# Patient Record
Sex: Female | Born: 1980 | Race: Black or African American | Hispanic: No | Smoking: Never smoker
Health system: Southern US, Community
[De-identification: ages and names within clinical notes are randomized; demographics above are authoritative.]

## PROBLEM LIST (undated history)

## (undated) DIAGNOSIS — F419 Anxiety disorder, unspecified: Secondary | ICD-10-CM

## (undated) DIAGNOSIS — D649 Anemia, unspecified: Secondary | ICD-10-CM

## (undated) DIAGNOSIS — F32A Depression, unspecified: Secondary | ICD-10-CM

## (undated) DIAGNOSIS — F329 Major depressive disorder, single episode, unspecified: Secondary | ICD-10-CM

## (undated) DIAGNOSIS — Z872 Personal history of diseases of the skin and subcutaneous tissue: Secondary | ICD-10-CM

## (undated) DIAGNOSIS — Z8742 Personal history of other diseases of the female genital tract: Secondary | ICD-10-CM

## (undated) DIAGNOSIS — M797 Fibromyalgia: Secondary | ICD-10-CM

## (undated) DIAGNOSIS — Z86018 Personal history of other benign neoplasm: Secondary | ICD-10-CM

## (undated) HISTORY — DX: Depression, unspecified: F32.A

## (undated) HISTORY — DX: Anxiety disorder, unspecified: F41.9

## (undated) HISTORY — DX: Personal history of diseases of the skin and subcutaneous tissue: Z87.2

## (undated) HISTORY — DX: Anemia, unspecified: D64.9

## (undated) HISTORY — PX: ABDOMINAL HYSTERECTOMY: SHX81

## (undated) HISTORY — DX: Major depressive disorder, single episode, unspecified: F32.9

## (undated) HISTORY — DX: Personal history of other benign neoplasm: Z86.018

## (undated) HISTORY — DX: Fibromyalgia: M79.7

## (undated) HISTORY — DX: Personal history of other diseases of the female genital tract: Z87.42

---

## 1999-06-07 HISTORY — PX: TUBAL LIGATION: SHX77

## 2006-06-06 HISTORY — PX: HYSTEROSCOPY: SHX211

## 2006-07-18 ENCOUNTER — Other Ambulatory Visit: Admission: RE | Admit: 2006-07-18 | Discharge: 2006-07-18 | Payer: Self-pay | Admitting: Obstetrics and Gynecology

## 2006-10-20 ENCOUNTER — Ambulatory Visit (HOSPITAL_COMMUNITY): Admission: RE | Admit: 2006-10-20 | Discharge: 2006-10-20 | Payer: Self-pay | Admitting: Obstetrics and Gynecology

## 2006-10-20 ENCOUNTER — Encounter (INDEPENDENT_AMBULATORY_CARE_PROVIDER_SITE_OTHER): Payer: Self-pay | Admitting: Obstetrics and Gynecology

## 2007-06-07 HISTORY — PX: LAPAROSCOPIC VAGINAL HYSTERECTOMY: SUR798

## 2007-08-29 ENCOUNTER — Ambulatory Visit (HOSPITAL_COMMUNITY): Admission: RE | Admit: 2007-08-29 | Discharge: 2007-08-30 | Payer: Self-pay | Admitting: Obstetrics and Gynecology

## 2007-08-29 ENCOUNTER — Encounter (INDEPENDENT_AMBULATORY_CARE_PROVIDER_SITE_OTHER): Payer: Self-pay | Admitting: Obstetrics and Gynecology

## 2009-12-12 ENCOUNTER — Emergency Department (HOSPITAL_COMMUNITY): Admission: EM | Admit: 2009-12-12 | Discharge: 2009-12-12 | Payer: Self-pay | Admitting: Emergency Medicine
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
## 2010-10-19 NOTE — Op Note (Signed)
NAME:  Mary Joyce, Mary Joyce                ACCOUNT NO.:  [id number]   MEDICAL RECORD NO.:  [id number]          PATIENT TYPE:  OIB   LOCATION:  9312                          [facility]ILITY:  [facility]   PHYSICIAN:  Huel Cote, M.D. DATE OF BIRTH:  04-25-1981   DATE OF PROCEDURE:  08/29/2007  DATE OF DISCHARGE:                               OPERATIVE REPORT   PREOPERATIVE DIAGNOSES:  1. Menorrhagia.  2. Anemia.  3. Fibroids.   POSTOPERATIVE DIAGNOSES:  1. Menorrhagia.  2. Anemia.  3. Fibroids.   PROCEDURE:  Laparoscopic assisted vaginal hysterectomy.   SURGEON:  Huel Cote, MD   ASSISTANT:  Bing Plume, MD   ANESTHESIA:  General.   FINDINGS:  The uterus was enlarged approximately 10 weeks in size.  Ovaries were within normal limits.  Other abdominal and pelvic  structures were normal.   SPECIMENS:  Uterus and cervix were sent to pathology.   ESTIMATED BLOOD LOSS:  One hundred fifty mL.   URINE OUTPUT:  One hundred fifth mL clear urine.   IV FLUIDS:  Two thousand mL LR.   PROCEDURE IN DETAIL:  The patient was taken to the operating room where  general anesthesia was obtained without difficulty.  She was then  prepped and draped in normal sterile fashion in the dorsal lithotomy  position.  A speculum was placed in the vagina, the cervix identified  and the Hulka tenaculum placed within it for uterine manipulation.  Foley catheter was placed.  Attention was then turned to the abdomen  where an infraumbilical incision was made with a scalpel approximately 1  cm in length.  The Veress needle was then introduced into the abdominal  cavity and normal pressure of approximately 3-4 was noted when  pneumoperitoneum was obtained with approximately 3 liters of CO2 gas.  Veress needle was then removed and the 5 mm OptiView trocar was utilized  to visualize direct entry into the peritoneal cavity.  The 5 mm scope  was then introduced and the patient placed in Trendelenburg and the  pelvis and abdomen inspected with the findings as previously stated.  Under direct visualization two additional 5 mm ports were placed in the  lateral quadrants.  Each trocar site was injected with quarter percent  Marcaine prior to incision.  These were placed without difficulty and  the patient's left adnexa was retracted somewhat medially.  The utero-  ovarian ligament was then taken down sequentially with the Harmonic  scalpel as well as the round ligament and the bladder flap was developed  with the Harmonic scalpel as well on this side.  Attention was then  turned to the right adnexa where in a similar fashion the utero-ovarian  ligament and the round ligament and broad ligament were taken down with  Harmonic scalpel.  The bladder flap was connected in the midline and  pushed away from the underlying cervix.  All areas were inspected and  there was no active bleeding noted.  Therefore all instruments were  removed from the trocars which were left in place and attention was  turned vaginally.   The Hulka  tenaculum was removed and two Jacobson tenaculums placed on  the cervix.  It was injected with a dilute solution of Pitressin 20 and  100 circumferentially.  Bovie cautery was then utilized to make a  circumferential incision around the cervix and the overlying vaginal  mucosa was trimmed away with Mayo scissors.  Posterior cul-de-sac was  entered sharply and the banana speculum was placed within it.  The  anterior cul-de-sac was likewise dissected and entered sharply and Sims  retractor placed within it.  The uterosacral ligaments were then clamped  with Zeppelin clamps, transected and suture ligated with zero Vicryl and  held in a hemostat.  The remainder of the broad ligament was taken down  sequentially with bites of Zeppelin clamps and suture ligated with zero  Vicryl at each step.  This was carried up to the level of the anterior  part of the broad ligament and the uterus was  delivered and everted in  the vagina.  The last remaining pedicle was taken down and the uterus  completely amputated and was suture ligated with zero Vicryl as well.  Uterus and cervix were handed off to pathology.  Pedicles were inspected  and found to be hemostatic.  One additional suture of zero Vicryl was  placed in a figure-of-eight nature on the patient's left which was  bleeding slightly.  This had a nice result and there was good hemostasis  noted.  The uterosacral ligaments were reapproximated with one suture of  zero Vicryl in an interrupted fashion and then the previously placed  sutures were tied to one another and the remainder trimmed away.  The  vaginal cuff was then closed with 2-0 Vicryl in a running locked fashion  and excellent hemostasis noted.  All instruments and sponges were  counted and removed from the vagina and instrument and sponge counts  were correct.   Attention was then re-returned to the patient's abdomen after gloves  were changed and the pneumoperitoneum once again obtained.  The patient  was placed in Trendelenburg and the cuff and ovaries inspected.  There  was no active bleeding noted and the irrigator  was utilized to irrigate  and inspect all areas.  The ureter was clearly visualized and  peristalsing on the patient's right.  On the left it was slightly more  difficult to see, however, appeared well away from the surgical field  even though it was not peristalsing as much.  The remainder of the  abdomen and pelvis appeared normal, therefore, all trocars were removed  under direct visualization and the pneumoperitoneum reduced through the  umbilical trocar.  The sites were then closed with 3-0 Vicryl in a  subcuticular stitch and all sponge, lap and needle counts were again  correct x2 and the patient was taken to the recovery room in stable  condition.      Huel Cote, M.D.  Electronically Signed     KR/MEDQ  D:  08/29/2007  T:   08/29/2007  Job:  [id number]

## 2010-10-22 NOTE — Op Note (Signed)
NAME:  Mary Joyce, Mary Joyce                ACCOUNT NO.:  [id number]   MEDICAL RECORD NO.:  [id number]          PATIENT TYPE:  AMB   LOCATION:  SDC                           [facility]   PHYSICIAN:  James A. Ashley Royalty, M.D.DATE OF BIRTH:  August 12, 1980   DATE OF PROCEDURE:  10/20/2006  DATE OF DISCHARGE:                               OPERATIVE REPORT   PREOPERATIVE DIAGNOSES:  1. Fibroid uterus including possible submucosal fibroid.  2. Menorrhagia - possibly secondary to #1.   POSTOPERATIVE DIAGNOSIS:  Submucosal fibroid.  Pathology pending.   PROCEDURE:  1. Diagnostic/operative hysteroscopy.  2. Dilatation and curettage.   SURGEON:  Rudy Jew. Ashley Royalty, M.D.   ANESTHESIA:  General.   ESTIMATED BLOOD LOSS:  Less 25 mL.   COMPLICATIONS:  None.   PACKS AND DRAINS:  None.   PROCEDURE:  The patient was taken to the operating room and placed in  the dorsal supine position.  After general anesthetic was administered,  she was placed in the lithotomy position and prepped and draped in usual  manner for vaginal surgery.  Posterior weighted retractor was placed per  vagina.  The anterior lip of the cervix was grasped with a single-tooth  tenaculum.  The uterus was gently sounded to approximately 8 cm and  noted be slightly retroverted.  The tubal ostia were visualized without  difficulty.  On the posterior surface of the uterine cavity, there was  apparent sessile fibroid present.  Appropriate photos were obtained.  Using the resectoscope at 40 watts power and the cutting waveform, the  submucosal component of the apparent fibroid was resected.  The specimen  was removed in a piecemeal fashion and submitted pathology for  histologic studies.  Hemostasis was obtained with the coagulation  waveform at approximately 40 watts power.   At this point, the patient was felt to have benefited maximally from the  operative portion of the procedure.   Attention was then turned to the uterine  curettage.  A medium size  curette was introduced in the uterine cavity, and an initial four  quadrant curettage technique was employed.  Then, a therapeutic  technique was employed.  All curettings were submitted to pathology for  histologic studies.   At this point, the patient was felt to have benefited maximally from the  surgical procedure.  The vaginal instruments were removed, hemostasis  noted and the procedure terminated.   The patient was taken to the recovery room in good condition.      James A. Ashley Royalty, M.D.  Electronically Signed     JAM/MEDQ  D:  10/20/2006  T:  10/20/2006  Job:  [id number]
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
## 2011-02-28 LAB — CBC
Hemoglobin: 10 — ABNORMAL LOW
MCHC: 32.5
MCHC: 33.2
MCV: 67.4 — ABNORMAL LOW
MCV: 68.6 — ABNORMAL LOW
Platelets: 379
RBC: 4.48

## 2011-07-30 ENCOUNTER — Other Ambulatory Visit: Payer: Self-pay | Admitting: Family Medicine

## 2011-09-18 ENCOUNTER — Other Ambulatory Visit: Payer: Self-pay | Admitting: Physician Assistant

## 2012-07-13 ENCOUNTER — Other Ambulatory Visit: Payer: Self-pay | Admitting: Family Medicine

## 2013-01-30 ENCOUNTER — Ambulatory Visit (INDEPENDENT_AMBULATORY_CARE_PROVIDER_SITE_OTHER): Payer: BC Managed Care – PPO | Admitting: Family Medicine

## 2013-01-30 VITALS — BP 118/84 | HR 66 | Temp 98.8°F | Resp 18 | Ht 66.0 in | Wt 185.8 lb

## 2013-01-30 DIAGNOSIS — R531 Weakness: Secondary | ICD-10-CM

## 2013-01-30 DIAGNOSIS — R5381 Other malaise: Secondary | ICD-10-CM

## 2013-01-30 DIAGNOSIS — R42 Dizziness and giddiness: Secondary | ICD-10-CM

## 2013-01-30 DIAGNOSIS — G4700 Insomnia, unspecified: Secondary | ICD-10-CM

## 2013-01-30 LAB — GLUCOSE, POCT (MANUAL RESULT ENTRY): POC Glucose: 98 mg/dl (ref 70–99)

## 2013-01-30 LAB — POCT CBC
Granulocyte percent: 59.9 %G (ref 37–80)
HCT, POC: 38.3 % (ref 37.7–47.9)
Hemoglobin: 12.2 g/dL (ref 12.2–16.2)
Lymph, poc: 1.8 (ref 0.6–3.4)
MCH, POC: 25.3 pg — AB (ref 27–31.2)
MCHC: 31.9 g/dL (ref 31.8–35.4)
MCV: 79.5 fL — AB (ref 80–97)
MID (cbc): 0.4 (ref 0–0.9)
MPV: 8.4 fL (ref 0–99.8)
POC Granulocyte: 3.4 (ref 2–6.9)
POC LYMPH PERCENT: 32.1 %L (ref 10–50)
POC MID %: 8 %M (ref 0–12)
Platelet Count, POC: 346 10*3/uL (ref 142–424)
RBC: 4.82 M/uL (ref 4.04–5.48)
RDW, POC: 15.2 %
WBC: 5.6 10*3/uL (ref 4.6–10.2)

## 2013-01-30 LAB — COMPREHENSIVE METABOLIC PANEL
ALT: 12 U/L (ref 0–35)
AST: 12 U/L (ref 0–37)
Albumin: 4.2 g/dL (ref 3.5–5.2)
Alkaline Phosphatase: 57 U/L (ref 39–117)
BUN: 12 mg/dL (ref 6–23)
CO2: 27 mEq/L (ref 19–32)
Calcium: 9.5 mg/dL (ref 8.4–10.5)
Chloride: 106 mEq/L (ref 96–112)
Creat: 0.63 mg/dL (ref 0.50–1.10)
Glucose, Bld: 91 mg/dL (ref 70–99)
Potassium: 4 mEq/L (ref 3.5–5.3)
Sodium: 138 mEq/L (ref 135–145)
Total Bilirubin: 0.5 mg/dL (ref 0.3–1.2)
Total Protein: 7.3 g/dL (ref 6.0–8.3)

## 2013-01-30 LAB — TSH: TSH: 2.077 u[IU]/mL (ref 0.350–4.500)

## 2013-01-30 MED ORDER — TRAZODONE HCL 150 MG PO TABS: 150.0000 mg | ORAL_TABLET | Freq: Every day | ORAL | Status: DC

## 2013-01-30 MED ORDER — CLONAZEPAM 1 MG PO TABS: 2.0000 mg | ORAL_TABLET | Freq: Every evening | ORAL | Status: DC | PRN
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
## 2013-02-13 ENCOUNTER — Ambulatory Visit: Payer: BC Managed Care – PPO

## 2013-02-13 ENCOUNTER — Emergency Department (HOSPITAL_COMMUNITY)
Admission: EM | Admit: 2013-02-13 | Discharge: 2013-02-13 | Disposition: A | Discharge status: Home / Self Care | Payer: BC Managed Care – PPO | Attending: Emergency Medicine | Admitting: Emergency Medicine

## 2013-02-13 ENCOUNTER — Ambulatory Visit (INDEPENDENT_AMBULATORY_CARE_PROVIDER_SITE_OTHER): Payer: BC Managed Care – PPO | Admitting: Family Medicine

## 2013-02-13 ENCOUNTER — Encounter (HOSPITAL_COMMUNITY): Payer: Self-pay

## 2013-02-13 VITALS — BP 110/88 | HR 76 | Temp 98.8°F | Resp 18 | Ht 66.0 in | Wt 185.8 lb

## 2013-02-13 DIAGNOSIS — R079 Chest pain, unspecified: Secondary | ICD-10-CM

## 2013-02-13 DIAGNOSIS — I319 Disease of pericardium, unspecified: Secondary | ICD-10-CM | POA: Insufficient documentation

## 2013-02-13 DIAGNOSIS — F329 Major depressive disorder, single episode, unspecified: Secondary | ICD-10-CM | POA: Insufficient documentation

## 2013-02-13 DIAGNOSIS — F411 Generalized anxiety disorder: Secondary | ICD-10-CM | POA: Insufficient documentation

## 2013-02-13 DIAGNOSIS — R197 Diarrhea, unspecified: Secondary | ICD-10-CM | POA: Insufficient documentation

## 2013-02-13 DIAGNOSIS — R51 Headache: Secondary | ICD-10-CM | POA: Insufficient documentation

## 2013-02-13 DIAGNOSIS — Z862 Personal history of diseases of the blood and blood-forming organs and certain disorders involving the immune mechanism: Secondary | ICD-10-CM | POA: Insufficient documentation

## 2013-02-13 DIAGNOSIS — R0789 Other chest pain: Secondary | ICD-10-CM | POA: Insufficient documentation

## 2013-02-13 DIAGNOSIS — R42 Dizziness and giddiness: Secondary | ICD-10-CM

## 2013-02-13 DIAGNOSIS — F3289 Other specified depressive episodes: Secondary | ICD-10-CM | POA: Insufficient documentation

## 2013-02-13 DIAGNOSIS — R112 Nausea with vomiting, unspecified: Secondary | ICD-10-CM | POA: Insufficient documentation

## 2013-02-13 LAB — BASIC METABOLIC PANEL
Calcium: 9.5 mg/dL (ref 8.4–10.5)
GFR calc non Af Amer: >90 mL/min (ref >90)
Glucose, Bld: 90 mg/dL (ref 70–99)
Potassium: 3.9 mEq/L (ref 3.5–5.1)
Sodium: 132 mEq/L — ABNORMAL LOW (ref 135–145)

## 2013-02-13 LAB — CBC
Hemoglobin: 12.8 g/dL (ref 12.0–15.0)
MCHC: 35.5 g/dL (ref 30.0–36.0)
Platelets: 379 10*3/uL (ref 150–400)
RBC: 4.94 MIL/uL (ref 3.87–5.11)

## 2013-02-13 LAB — TROPONIN I: Troponin I: <0.3 ng/mL (ref <0.30)

## 2013-02-13 LAB — POCT I-STAT TROPONIN I: Troponin i, poc: 0 ng/mL (ref 0.00–0.08)

## 2013-02-13 MED ORDER — NAPROXEN 500 MG PO TABS: 500.0000 mg | ORAL_TABLET | Freq: Two times a day (BID) | ORAL | Status: DC

## 2013-02-13 MED ORDER — IBUPROFEN 800 MG PO TABS
800.0000 mg | ORAL_TABLET | Freq: Once | ORAL | Status: AC
  Administered 2013-02-13: 800 mg via ORAL
  Filled 2013-02-13: qty 1

## 2013-02-13 NOTE — ED Notes (Signed)
Pt c/o one occurrence of Left side chest pain 2 weeks ago, and another episode of chest pain, N/V/D, and feeling lightheaded. Pt seen at [facility] and prescribed medication for anxiety
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
## 2013-02-13 NOTE — ED Provider Notes (Signed)
Mary Joyce S 8:00 PM patient discussed in sign out. Patient with low risk factors for CAD presented with complaints of chest pain for the past 2 weeks. Patient is very anxious and has history of anxiety. Does have some increased stress. Plan to rule out possible ACS with second troponin. Initial EKG and troponin unremarkable. Other labs also unremarkable.  Second troponin negative. Patient felt stable for discharge home to followup with PCP  Angus Seller, PA-C 02/14/13 [phone]

## 2013-02-13 NOTE — ED Provider Notes (Signed)
CSN: [id number]     Arrival date & time 02/13/13  1638 History   First MD Initiated Contact with Patient 02/13/13 1910     Chief Complaint  Patient presents with  . Chest Pain   (Consider location/radiation/quality/duration/timing/severity/associated sxs/prior Treatment) HPI Pt is a 32yo female with hx of anxiety, depression, and anemia c/o intermittent left sided chest pain, initially started 2 weeks ago but came back again earlier today, sharp in nature, 5/10, lasted about 1 hour, started while pt was at work working under stressful conditions, also c/o N/V/D and feeling "extremely" lightheaded.  Pt was evaluated last week at [facility] and prescribe anxiety medication for sleep.  Pt states the medication initially helped with her sleep but has stopped working.  Pt was reevaluated at [facility] today for CP and advised to come to ED for further evaluation after negative CXR and benign PE.  Denies fever, recent travel, sick contacts, or personal hx of heart problems.  Does report father died for CHF at age 84yo and paternal grandparents having CAD.    Past Medical History  Diagnosis Date  . Anemia   . Anxiety   . Depression    Past Surgical History  Procedure Laterality Date  . Cesarean section    . Abdominal hysterectomy     Family History  Problem Relation Age of Onset  . Hypertension Mother   . Heart disease Father    History  Substance Use Topics  . Smoking status: Never Smoker   . Smokeless tobacco: Not on file  . Alcohol Use: No   OB History   Grav Para Term Preterm Abortions TAB SAB Ect Mult Living                 Review of Systems  Constitutional: Negative for fever and chills.  HENT: Negative for neck pain and neck stiffness.   Respiratory: Negative for cough and shortness of breath.   Cardiovascular: Positive for chest pain. Negative for palpitations and leg swelling.  Gastrointestinal: Positive for nausea, vomiting and diarrhea. Negative for abdominal pain.  Neurological:  Positive for light-headedness. Negative for dizziness and headaches.  All other systems reviewed and are negative.    Allergies  Review of patient's allergies indicates no known allergies.  Home Medications   Current Outpatient Rx  Name  Route  Sig  Dispense  Refill  . clonazePAM (KLONOPIN) 1 MG tablet   Oral   Take 2 tablets (2 mg total) by mouth at bedtime as needed for anxiety.   30 tablet   2   . traZODone (DESYREL) 150 MG tablet   Oral   Take 1 tablet (150 mg total) by mouth at bedtime.   30 tablet   2    BP 117/90  Pulse 64  Temp(Src) 98.7 F (37.1 C) (Oral)  Resp 16  Ht 5\' 5"  (1.651 m)  Wt 185 lb (83.915 kg)  BMI 30.79 kg/m2  SpO2 100% Physical Exam  Nursing note and vitals reviewed. Constitutional: She is oriented to person, place, and time. She appears well-developed and well-nourished. No distress.  Pt appears well. NAD.  HENT:  Head: Normocephalic and atraumatic.  Eyes: Conjunctivae and EOM are normal. Pupils are equal, round, and reactive to light. Right eye exhibits no discharge. Left eye exhibits no discharge. No scleral icterus.  Neck: Normal range of motion. Neck supple.  No nuchal rigidity or meningeal signs.    Cardiovascular: Normal rate, regular rhythm and normal heart sounds.  Exam reveals no gallop  and no friction rub.   No murmur heard. Nl rate and rhythm w/o murmurs, rubs or gallops  Pulmonary/Chest: Effort normal and breath sounds normal. No respiratory distress. She has no wheezes. She has no rales. She exhibits no tenderness.  Lungs: CTAB. No respiratory distress. Able to speak in full sentences. Chest pain not reproducible.   Abdominal: Soft. Bowel sounds are normal. She exhibits no distension and no mass. There is no tenderness. There is no rebound and no guarding.  Musculoskeletal: Normal range of motion.  Neurological: She is alert and oriented to person, place, and time. She has normal strength. No cranial nerve deficit or sensory  deficit. Coordination normal. GCS eye subscore is 4. GCS verbal subscore is 5. GCS motor subscore is 6.  Skin: Skin is warm and dry. She is not diaphoretic.    ED Course  Procedures (including critical care time) Labs Review Labs Reviewed  CBC - Abnormal; Notable for the following:    MCV 73.1 (*)    MCH 25.9 (*)    All other components within normal limits  BASIC METABOLIC PANEL - Abnormal; Notable for the following:    Sodium 132 (*)    All other components within normal limits  TROPONIN I  POCT I-STAT TROPONIN I   Imaging Review Dg Chest 2 View  02/13/2013   *RADIOLOGY REPORT*  Clinical Data: Chest pain  CHEST - 2 VIEW  Comparison: None.  Findings: Cardiomediastinal silhouette appears normal.  No acute pulmonary disease is noted.  Bony thorax is intact.  IMPRESSION: No acute cardiopulmonary abnormality seen.  Clinically significant discrepancy from primary report, if provided: None   Original Report Authenticated By: Lupita Raider.,  M.D.     Date: 02/13/2013  Rate: 60  Rhythm: premature atrial contractions (PAC)  QRS Axis: normal  Intervals: normal  ST/T Wave abnormalities: nonspecific ST/T changes  Conduction Disutrbances:none  Narrative Interpretation: abnormal, unusual P axis, possible ectopic atrial rhythm with PACs  Old EKG Reviewed: none available    MDM   1. Pericarditis   2. Chest pain    Pt c/o intermittent CP and has family hx of CAD with father dying of CHF and paternal grandparents with heart disease.  CXR performed at [facility] earlier today, unremarkable.  Not concerned for PE based off PERC criteria.  Pt had hysterectomy several years ago so not concerned for pregnancy.  istat troponin: negative, will get repeat regular troponin after 3hrs.   EKG shows PACs and unusual P axis.  Discussed findings with Dr. Ranae Palms who states findings consistent with pericarditis.  Will tx with ibuprofen.  If second troponin negative will discharge home, Rx: ibuprofen TID and  f/u with Cardiology.  8:39 PM Signed out to Ivonne Andrew, PA-C at shift change.      Junius Finner, PA-C 02/13/13 2048

## 2013-02-13 NOTE — Patient Instructions (Addendum)
Please proceed to the ER for further evaluation.  I will call and let them know that you are on the way.

## 2013-02-14 NOTE — ED Provider Notes (Signed)
Medical screening examination/treatment/procedure(s) were performed by non-physician practitioner and as supervising physician I was immediately available for consultation/collaboration.   Loren Racer, MD 02/14/13 1538

## 2013-02-20 ENCOUNTER — Encounter: Payer: Self-pay | Admitting: *Deleted

## 2013-02-20 ENCOUNTER — Ambulatory Visit (INDEPENDENT_AMBULATORY_CARE_PROVIDER_SITE_OTHER): Payer: BC Managed Care – PPO | Admitting: Family Medicine

## 2013-02-20 VITALS — BP 119/83 | HR 80 | Temp 98.2°F | Resp 18 | Ht 66.0 in | Wt 185.0 lb

## 2013-02-20 DIAGNOSIS — G4700 Insomnia, unspecified: Secondary | ICD-10-CM

## 2013-02-20 DIAGNOSIS — E871 Hypo-osmolality and hyponatremia: Secondary | ICD-10-CM

## 2013-02-20 DIAGNOSIS — F329 Major depressive disorder, single episode, unspecified: Secondary | ICD-10-CM

## 2013-02-20 DIAGNOSIS — F341 Dysthymic disorder: Secondary | ICD-10-CM

## 2013-02-20 MED ORDER — FLUOXETINE HCL 20 MG PO TABS: 20.0000 mg | ORAL_TABLET | Freq: Every day | ORAL | Status: DC

## 2013-02-20 MED ORDER — ESZOPICLONE 1 MG PO TABS: 1.0000 mg | ORAL_TABLET | Freq: Every day | ORAL | Status: DC

## 2013-02-20 NOTE — Patient Instructions (Addendum)
Taper down off of the trazadone over the course of 7- 10 days. Cut down to a 1/2 tablet and then a 1/4 tablet, then stop. Once you are off the trazadone you can start taking the lunesta at bedtime, and start the prozac.

## 2013-02-20 NOTE — Progress Notes (Addendum)
[facility] [address] [place] [address] [phone]  Date:  02/20/2013   Name:  Mary Joyce   DOB:  1981/04/09   MRN:  [id number]  PCP:  No primary provider on file.    Chief Complaint: Follow-up   History of Present Illness:  Mary Joyce is a 32 y.o. very pleasant female patient who presents with the following:  Seen here one week ago with complaint of being lightheaded and having sharp chest pains.  She was referred to the ED. There she was dx with pericarditis. Had negative troponin X2.  She was treated with ibuprofen, asked to f/u with cardiology.  Her cardiology appt is 03/04/13 with [place].    Here today to follow-up. She continues to feel lightheaded, "really dizzy," and her chest "feels tight."  She also had has some "sharp pains" in her chest. She notes that these sx will "come and go."  She has not noted any particular pattern to her pain.  She has felt this way for about one month.   Notes that "I haven't been sleeping."  She will go to sleep, then wake up 30 minutes later and fall asleep again at 5am, and hast to get up at 7am.    She does admit that she thinks this may be caused by stress.  She has had anxiety and panic attacks in the past, but has not noted CP like this.  No syncope.  "I feel like I'm going to pass out all the time."    She started a new job in April of this year.  "It's a stressful environment, it's really not working out at all."   She was referred to see Dr. Evelene Croon earlier this year but never did go.   She does admit to feeling irritable, crying easily, not feeling like herself.  She has felt this way since May of this year. No SI or HI.     TSH checked last month.   She has used [place] in the past- lunesta seemed to work the best and did not "give me a hang- over."   She also admits to having started a strict diet and exercise program in conjunction with a trainer about 2 weeks ago.  It sounds like she is not  eating more an 1K calories a day and is not eating any carbs.      Her work- week is Tuesday through Saturday.  She would like to use some vacation days and be out until next Tuesday   Reviewed ER records.  Notes she had hyponatremia at her visit last week, Na was 132 There are no active problems to display for this patient.   Past Medical History  Diagnosis Date  . Anemia   . Anxiety   . Depression   . History of uterine fibroid   . History of keloid of skin   . H/O menorrhagia     Past Surgical History  Procedure Laterality Date  . Cesarean section    . Laparoscopic vaginal hysterectomy  2009  . Tubal ligation Bilateral 2001  . Hysteroscopy  2008    with resection of fibroid     History  Substance Use Topics  . Smoking status: Never Smoker   . Smokeless tobacco: Not on file  . Alcohol Use: No    Family History  Problem Relation Age of Onset  . Hypertension Mother   . Heart disease Father   . Diabetes  No Known Allergies  Medication list has been reviewed and updated.  Current Outpatient Prescriptions on File Prior to Visit  Medication Sig Dispense Refill  . clonazePAM (KLONOPIN) 1 MG tablet Take 2 tablets (2 mg total) by mouth at bedtime as needed for anxiety.  30 tablet  2  . naproxen (NAPROSYN) 500 MG tablet Take 1 tablet (500 mg total) by mouth 2 (two) times daily.  30 tablet  0  . traZODone (DESYREL) 150 MG tablet Take 1 tablet (150 mg total) by mouth at bedtime.  30 tablet  2   No current facility-administered medications on file prior to visit.    Review of Systems:  As per HPI- otherwise negative.   Physical Examination: Filed Vitals:   02/20/13 1419  BP: 118/70  Pulse: 98  Temp: 98.2 F (36.8 C)  Resp: 18   Filed Vitals:   02/20/13 1419  Height: 5\' 6"  (1.676 m)  Weight: 185 lb (83.915 kg)   Body mass index is 29.87 kg/(m^2). Ideal Body Weight: Weight in (lb) to have BMI = 25: 154.6  GEN: WDWN, NAD, Non-toxic, A & O x 3,  overweight, sometimes tearful HEENT: Atraumatic, Normocephalic. Neck supple. No masses, No LAD. Ears and Nose: No external deformity. CV: RRR, No M/G/R. No JVD. No thrill. No extra heart sounds. PULM: CTA B, no wheezes, crackles, rhonchi. No retractions. No resp. distress. No accessory muscle use. ABD: S, NT, ND, +BS. No rebound. No HSM. EXTR: No c/c/e.  No calf pain or tenderness  NEURO Normal gait.  PSYCH: Normally interactive. Conversant. Not depressed or anxious appearing.  Calm demeanor.   See orthostatics. No drop in BP but pulse did go up from 64- 80 BPM  Assessment and Plan: Anxiety and depression - Plan: FLUoxetine (PROZAC) 20 MG tablet  Hyponatremia - Plan: Basic metabolic panel  Insomnia - Plan: eszopiclone (LUNESTA) 1 MG TABS tablet  Here today to follow- up chest pain, SOB and insomnia likely related to anxiety and depression.  Offered to do a D dimer but she declined.  (Low risk for PE given normal pulse, she does not use OCP, is not a smoker)  Trazadone is not helping her to sleep like she would like.  Will taper off and d/c trazadone, and then start on prozac and lunesta prn for sleep. She will continue to use her klonopin- advised that she can try taking one during the day and one in the evening prn, as long as she does not feel drowsy (if driving).  Advised NOT to combine klonopin and lunesta together.  Advised her to use lunesta only prn, and to avoid daily use.    Suspect some of her sx may be due to inadequate calories.  Encouraged her to add 2 good snacks with a mix of carbs, protein and fat each day.  She may exercise as tolerated.  Asked her to increase her salt intake as well due to hyponatremia.   Will plan further follow- up pending labs. If any problems in the meantime she will let me know right away   Signed Abbe Amsterdam, MD  9/18: called her back to check on her and go over labs.  Sodium now ok.  She has been eating more and feels better.  Asked her to  please keep me up to date on her progress   Results for orders placed in visit on 02/20/13  BASIC METABOLIC PANEL      Result Value Range   Sodium 137  135 -  145 mEq/L   Potassium 4.1  3.5 - 5.3 mEq/L   Chloride 100  96 - 112 mEq/L   CO2 25  19 - 32 mEq/L   Glucose, Bld 79  70 - 99 mg/dL   BUN 9  6 - 23 mg/dL   Creat 1.61  0.96 - 0.45 mg/dL   Calcium 9.8  8.4 - 40.9 mg/dL

## 2013-02-21 LAB — BASIC METABOLIC PANEL
BUN: 9 mg/dL (ref 6–23)
Calcium: 9.8 mg/dL (ref 8.4–10.5)
Chloride: 100 mEq/L (ref 96–112)
Creat: 0.54 mg/dL (ref 0.50–1.10)

## 2013-02-27 ENCOUNTER — Telehealth: Payer: Self-pay

## 2013-02-27 NOTE — Telephone Encounter (Signed)
Called her back.  States that she is feeling upset, crying, vomiting, having diarrhea.  She did not go to her job today and is afraid she will lose her job.  Encouraged her to come in to see [place] today.   She states she would rather come in tomorrow when I am in the office.  However, I advised her that if she is not doing ok or is in any danger she should come in to clinic right away or go to the ER.  She agreed

## 2013-02-27 NOTE — Telephone Encounter (Signed)
Dr copland,    Patient states the medications is not working.  [phone]

## 2013-02-27 NOTE — Telephone Encounter (Signed)
Patient advised the sleeping meds (lunesta) not helping, she states she has not slept in 3 nights.

## 2013-02-28 ENCOUNTER — Ambulatory Visit (INDEPENDENT_AMBULATORY_CARE_PROVIDER_SITE_OTHER): Payer: BC Managed Care – PPO | Admitting: Family Medicine

## 2013-02-28 VITALS — BP 110/76 | HR 72 | Temp 98.7°F | Resp 18 | Ht 66.0 in | Wt 186.6 lb

## 2013-02-28 DIAGNOSIS — F4323 Adjustment disorder with mixed anxiety and depressed mood: Secondary | ICD-10-CM

## 2013-02-28 DIAGNOSIS — G4700 Insomnia, unspecified: Secondary | ICD-10-CM

## 2013-02-28 NOTE — Patient Instructions (Addendum)
You may take up to 2mg  of lunesta at bedtime (do not combine with trazodone or clonazepam).    Please work on getting in to see a therapist   Continue to avoid taking trazadone for the time being.   If you think you may be in danger of hurting yourself or someone else please call 911

## 2013-02-28 NOTE — Progress Notes (Signed)
[facility] [address], [place] [address] [phone]  Date:  02/28/2013   Name:  Mary Joyce   DOB:  December 01, 1980   MRN:  [id number]  PCP:  No primary provider on file.    Chief Complaint: Follow-up   History of Present Illness:  Mary Joyce is a 32 y.o. very pleasant female patient who presents with the following:  She is here today to follow-up anxiety, depression, stress and insomnia.   She has noted vomiting and diarrhea.  The vomiting started 2 days ago- she has vomited 4 times.  She has diarrhea "all day yesterday, nothing today."  No vomiting today as of yet.    She continued to have chest pain yesterday.  Right now "it just feels tight."  She has been evaluated and ruled- out for ACS at the ER about 2 weeks ago.    We started seeing her in August of this year.  She admits to anxiety, depression, panic attacks and crying over years past.  However,  she has not had sx this severe before    She has used lunesta in the past with success. However, she notes that when she takes it now "I do not sleep at all."  Last night she "only got 2 hours of sleep."   No one else had any GI symptoms.    2 days ago she went to work, and "was not feeling too bad."  However, she then got reprimanded for work not being done and felt worse again.  Admits that her relationship with her BF is "not that great" but this is not really a change.    She does not drink or use any drugs.   She did go to work today, but left early.    In 2012 she got divorced, and had sx of insomnia/ crying/ panic attacks.  However her sx now are worse than they were in the past.    She has a 17 year old son and 87 year old daughter- they are living with their dad.  Her life went downhill following her divorce, and she subsequently lost her job.  When her unemployment ran out she also lost her car and home, so her children went to live with her father. She is rarely able to see them due to her  work schedule.  Of 2pm- 10pm Monday through saturday  She is taking prozac- has been taking it for about one week now.  She has stopped the trazadone, but did take one last night in an attempt to sleep.    She has been eating ok, except for when she was sick.  She denies any suicidal plans or intent  Patient Active Problem List   Diagnosis Date Noted  . Anxiety and depression 02/20/2013    Past Medical History  Diagnosis Date  . Anemia   . Anxiety   . Depression   . History of uterine fibroid   . History of keloid of skin   . H/O menorrhagia     Past Surgical History  Procedure Laterality Date  . Cesarean section    . Laparoscopic vaginal hysterectomy  2009  . Tubal ligation Bilateral 2001  . Hysteroscopy  2008    with resection of fibroid     History  Substance Use Topics  . Smoking status: Never Smoker   . Smokeless tobacco: Not on file  . Alcohol Use: No    Family History  Problem Relation Age  of Onset  . Hypertension Mother   . Heart disease Father   . Diabetes      No Known Allergies  Medication list has been reviewed and updated.  Current Outpatient Prescriptions on File Prior to Visit  Medication Sig Dispense Refill  . clonazePAM (KLONOPIN) 1 MG tablet Take 2 tablets (2 mg total) by mouth at bedtime as needed for anxiety.  30 tablet  2  . eszopiclone (LUNESTA) 1 MG TABS tablet Take 1 tablet (1 mg total) by mouth at bedtime. Take immediately before bedtime  30 tablet  0  . FLUoxetine (PROZAC) 20 MG tablet Take 1 tablet (20 mg total) by mouth daily.  30 tablet  3  . naproxen (NAPROSYN) 500 MG tablet Take 1 tablet (500 mg total) by mouth 2 (two) times daily.  30 tablet  0  . traZODone (DESYREL) 150 MG tablet Take 1 tablet (150 mg total) by mouth at bedtime.  30 tablet  2   No current facility-administered medications on file prior to visit.    Review of Systems:  As per HPI- otherwise negative.   Physical Examination: Filed Vitals:   02/28/13 1402   BP: 110/76  Pulse: 72  Temp: 98.7 F (37.1 C)  Resp: 18   Filed Vitals:   02/28/13 1402  Height: 5\' 6"  (1.676 m)  Weight: 186 lb 9.6 oz (84.641 kg)   Body mass index is 30.13 kg/(m^2). Ideal Body Weight: Weight in (lb) to have BMI = 25: 154.6  GEN: WDWN, NAD, Non-toxic, A & O x 3, overweight.  Appears stressed, but not tearful today HEENT: Atraumatic, Normocephalic. Neck supple. No masses, No LAD. Ears and Nose: No external deformity. CV: RRR, No M/G/R. No JVD. No thrill. No extra heart sounds. PULM: CTA B, no wheezes, crackles, rhonchi. No retractions. No resp. distress. No accessory muscle use. ABD: S, NT, ND, +BS. No rebound. No HSM. EXTR: No c/c/e NEURO Normal gait.  PSYCH: Normally interactive. Conversant.   Assessment and Plan: Insomnia  Adjustment disorder with mixed anxiety and depressed mood  Mary Joyce continues to have a very hard time with anxiety, depression and insomnia.  She is not able to function well at her job and fears she is in danger of losing this job too.  She does realize that her physical sx are most likely linked to psychological distress. She would like to take 2 weeks off of her job as an unpaid leave- I prepared a letter for her.  Encouraged her to give the prozac a little more time- she has only been on it for a week.  Also, she can safety take 2 mg of lunesta qhs if needed.  Discussed seeing a therapist, and she also plans to call Dr. Evelene Croon and try to schedule an appt.    She agreed to let me know how she is doing in the next few days- Sooner if worse.  If she feels she is in danger of self- harm she is instructed to call 911.  Brainstormed ways she might be able to spend more time with her kids, which she thinks will be helpful for her   Signed Abbe Amsterdam, MD

## 2013-03-04 ENCOUNTER — Telehealth: Payer: Self-pay

## 2013-03-04 ENCOUNTER — Encounter: Payer: BC Managed Care – PPO | Admitting: Cardiology

## 2013-03-04 NOTE — Telephone Encounter (Signed)
We faxed a note for patient to go back to work and her employer never got it please fax this again go [phone]

## 2013-03-04 NOTE — Telephone Encounter (Signed)
The pts employer is calling to make sure that the medical note that they received about the pt being out fropm 9-25 to 10-10 was correct.  Call back number is [phone] ask for analaura

## 2013-03-04 NOTE — Telephone Encounter (Signed)
re-faxed

## 2013-03-05 NOTE — Telephone Encounter (Signed)
Yes, note is correct was faxed yesterday. Called left message to advise.

## 2013-05-01 ENCOUNTER — Encounter (HOSPITAL_COMMUNITY): Payer: Self-pay | Admitting: Emergency Medicine

## 2013-05-01 ENCOUNTER — Emergency Department (HOSPITAL_COMMUNITY)
Admission: EM | Admit: 2013-05-01 | Discharge: 2013-05-02 | Disposition: A | Discharge status: Other Acute Inpatient Hospital | Payer: BC Managed Care – PPO | Attending: Emergency Medicine | Admitting: Emergency Medicine

## 2013-05-01 DIAGNOSIS — Z8742 Personal history of other diseases of the female genital tract: Secondary | ICD-10-CM | POA: Insufficient documentation

## 2013-05-01 DIAGNOSIS — T43502A Poisoning by unspecified antipsychotics and neuroleptics, intentional self-harm, initial encounter: Secondary | ICD-10-CM | POA: Insufficient documentation

## 2013-05-01 DIAGNOSIS — T43294A Poisoning by other antidepressants, undetermined, initial encounter: Secondary | ICD-10-CM | POA: Insufficient documentation

## 2013-05-01 DIAGNOSIS — IMO0002 Reserved for concepts with insufficient information to code with codable children: Secondary | ICD-10-CM | POA: Insufficient documentation

## 2013-05-01 DIAGNOSIS — G479 Sleep disorder, unspecified: Secondary | ICD-10-CM | POA: Insufficient documentation

## 2013-05-01 DIAGNOSIS — T424X4A Poisoning by benzodiazepines, undetermined, initial encounter: Secondary | ICD-10-CM | POA: Insufficient documentation

## 2013-05-01 DIAGNOSIS — F411 Generalized anxiety disorder: Secondary | ICD-10-CM | POA: Insufficient documentation

## 2013-05-01 DIAGNOSIS — Z862 Personal history of diseases of the blood and blood-forming organs and certain disorders involving the immune mechanism: Secondary | ICD-10-CM | POA: Insufficient documentation

## 2013-05-01 DIAGNOSIS — Z872 Personal history of diseases of the skin and subcutaneous tissue: Secondary | ICD-10-CM | POA: Insufficient documentation

## 2013-05-01 LAB — CBC
Hemoglobin: 12.5 g/dL (ref 12.0–15.0)
MCH: 25.2 pg — ABNORMAL LOW (ref 26.0–34.0)
Platelets: 340 10*3/uL (ref 150–400)
RBC: 4.96 MIL/uL (ref 3.87–5.11)
WBC: 8.4 10*3/uL (ref 4.0–10.5)

## 2013-05-01 LAB — COMPREHENSIVE METABOLIC PANEL
ALT: 14 U/L (ref 0–35)
AST: 13 U/L (ref 0–37)
Alkaline Phosphatase: 62 U/L (ref 39–117)
CO2: 24 mEq/L (ref 19–32)
Calcium: 9.4 mg/dL (ref 8.4–10.5)
Chloride: 102 mEq/L (ref 96–112)
GFR calc Af Amer: >90 mL/min (ref >90)
GFR calc non Af Amer: >90 mL/min (ref >90)
Glucose, Bld: 105 mg/dL — ABNORMAL HIGH (ref 70–99)
Potassium: 3.5 mEq/L (ref 3.5–5.1)
Sodium: 137 mEq/L (ref 135–145)
Total Bilirubin: 0.6 mg/dL (ref 0.3–1.2)

## 2013-05-01 LAB — POCT PREGNANCY, URINE: Preg Test, Ur: NEGATIVE

## 2013-05-01 LAB — RAPID URINE DRUG SCREEN, HOSP PERFORMED
Amphetamines: NOT DETECTED
Benzodiazepines: NOT DETECTED
Tetrahydrocannabinol: NOT DETECTED

## 2013-05-01 LAB — GLUCOSE, CAPILLARY

## 2013-05-01 LAB — SALICYLATE LEVEL: Salicylate Lvl: <2 mg/dL — ABNORMAL LOW (ref 2.8–20.0)
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
## 2013-05-01 NOTE — ED Notes (Signed)
Patient in blue scrubs and red socks.  

## 2013-05-01 NOTE — ED Notes (Signed)
Pt accepted by Dr Dub Mikes to the service of Dr Elsie Saas. Rm 502 bed 2.

## 2013-05-01 NOTE — ED Provider Notes (Signed)
Medical screening examination/treatment/procedure(s) were performed by non-physician practitioner and as supervising physician I was immediately available for consultation/collaboration.  EKG Interpretation   None        Ethelda Chick, MD 05/01/13 2138
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
## 2013-05-01 NOTE — ED Notes (Signed)
Pt states was not trying to harm herself. She reports chronic anxiety and has not been able to sleep much over the past few days. Pt states she took a small handful of trazodone and clonazepam to help her sleep.

## 2013-05-01 NOTE — BH Assessment (Signed)
[facility] Assessment Progress Note  At 17:15 I spoke to [facility] in anticipation of TTS assessment scheduled for 17:25.  Doylene Canning, MA Triage Specialist 05/01/2013 @ 17:22

## 2013-05-01 NOTE — ED Notes (Signed)
IVC papers served. 

## 2013-05-01 NOTE — ED Provider Notes (Signed)
Pt was seen by Lowella Dell and Supervised by Dierdre Forth, PA-C and Jerelyn Scott, MD.  Mary Joyce is a 32 y.o. female presents with overdose of unknown amount of clonazepam and trazadone at 1pm today.  EMS reports that she told her boyfriend "My kids will be better off without me."  She adamantly denies this to me and reports that she took only a small handful of medication in an attempt to sleep. She reports she is unemployed and is having problems with her boyfriend causing a significant amount of stress in her life. She states because of this she has difficulty remaining asleep for the last several minute. She reports she is tired and just wants to sleep. She endorses mild depression and some hopelessness but denies suicidal ideation, homicidal ideation, auditory or visual hallucinations. She denies history of suicide attempt or previous suicidal ideation.  She takes trazodone to sleep and clonazepam for anxiety.  Face to face Exam:   General: Awake  HEENT: Atraumatic  Resp: Normal effort  Abd: Nondistended  Neuro:No focal weakness  Lymph: No adenopathy  5:06 PM Lowella Dell discussed this patient with poison control who recommends Tylenol level and observation.  TTS consult made for evaluation, though I doubt she will meet inpatient criteria.  Pt is currently here voluntarily.    7:02 PM Discussed with Doylene Canning who recommends inpatient and commitment of the patient.  I have discussed this with the patient who refuses to stay voluntarily.  Will complete IVC paperwork.    9:27 PM IVC paperwork completed and approved by the magistrate.  Pt has a bed at [facility]. She is clear for transfer.    Dierdre Forth, PA-C 05/01/13 2127

## 2013-05-01 NOTE — ED Notes (Signed)
Patient has 1 bag of belongings: 1 pair of sweat pants, 1 sweat shirt, 1 tank top, 1 pair of athletic shoes, 1 empty medicine bottle, 1 cell phone. --Belongings located at nurses station across from room 4.

## 2013-05-01 NOTE — ED Notes (Signed)
Per ems report:  Pt intentionally took unknown amount of lorazapam and trazadone at approx 1pm today.   Told her boyfriend "my kids will be better off without me."  VSS: 129/95, 100% RA, 84, CBG 110.  AX0X4 but sleepy.

## 2013-05-01 NOTE — ED Provider Notes (Signed)
CSN: [id number]     Arrival date & time 05/01/13  1534 History   First MD Initiated Contact with Patient 05/01/13 1543     Chief Complaint  Patient presents with  . Medical Clearance   (Consider location/radiation/quality/duration/timing/severity/associated sxs/prior Treatment) HPI 32 yo female presents via EMS with reports of overdose of clonazepam and trazadone at approximately 1:00 pm today. Patient states it was a small handful but is unsure the exact number of pills. Per EMS, she told her boyfriend "My kids will be better off without me".  She states that she is unemployed and has been having problems with boyfriend at home. She reports a hx of difficulties sleeping and states she has not been able sleep in 3 days. States she took the medicine to fall asleep with no intention of self harm. Denies SI/HI, auditory/visual hallucinations, and previous SI. Past Medical History  Diagnosis Date  . Anemia   . Anxiety   . History of uterine fibroid   . History of keloid of skin   . H/O menorrhagia   . Depression    Past Surgical History  Procedure Laterality Date  . Cesarean section    . Laparoscopic vaginal hysterectomy  2009  . Tubal ligation Bilateral 2001  . Hysteroscopy  2008    with resection of fibroid    Family History  Problem Relation Age of Onset  . Hypertension Mother   . Heart disease Father   . Diabetes     History  Substance Use Topics  . Smoking status: Never Smoker   . Smokeless tobacco: Never Used  . Alcohol Use: No   OB History   Grav Para Term Preterm Abortions TAB SAB Ect Mult Living                 Review of Systems  Respiratory: Negative for shortness of breath.   Cardiovascular: Negative for chest pain.  Neurological: Negative for headaches.  Psychiatric/Behavioral: Positive for sleep disturbance. Negative for suicidal ideas, hallucinations and self-injury. The patient is nervous/anxious.   All other systems reviewed and are  negative.    Allergies  Review of patient's allergies indicates no known allergies.  Home Medications   No current outpatient prescriptions on file. BP 95/61  Pulse 76  Temp(Src) 98.4 F (36.9 C) (Oral)  Resp 17  SpO2 98% Physical Exam  Constitutional: She is oriented to person, place, and time. She appears well-developed and well-nourished. No distress.  HENT:  Head: Normocephalic and atraumatic.  Eyes: EOM are normal. Pupils are equal, round, and reactive to light.  Cardiovascular: Normal rate and regular rhythm.   Pulmonary/Chest: Effort normal and breath sounds normal.  Neurological: She is alert and oriented to person, place, and time.  Psychiatric: She has a normal mood and affect. Judgment and thought content normal. Her mood appears not anxious. Her affect is not angry, not labile and not inappropriate. Her speech is not rapid and/or pressured. She is agitated. She is not aggressive, not hyperactive and not combative. Thought content is not paranoid and not delusional. Cognition and memory are normal. She does not exhibit a depressed mood. She expresses no homicidal and no suicidal ideation. She expresses no suicidal plans and no homicidal plans.    ED Course  Procedures (including critical care time) Labs Review Labs Reviewed  CBC - Abnormal; Notable for the following:    MCV 73.8 (*)    MCH 25.2 (*)    All other components within normal limits  COMPREHENSIVE METABOLIC  PANEL - Abnormal; Notable for the following:    Glucose, Bld 105 (*)    All other components within normal limits  SALICYLATE LEVEL - Abnormal; Notable for the following:    Salicylate Lvl <2.0 (*)    All other components within normal limits  ETHANOL  ACETAMINOPHEN LEVEL  URINE RAPID DRUG SCREEN (HOSP PERFORMED)  GLUCOSE, CAPILLARY  POCT PREGNANCY, URINE   Imaging Review No results found.  EKG Interpretation   None       MDM   1. Overdose, initial encounter   Poison control  contacted. They recommended observation with no intervention at this time. Patient Tylenol/salicylate levels negative. Urine Drug screen negative. Urine preg negative. Blood work shows no significant findings. Advised patient to see counselor for stress and problems at home. Patient discharged home in good condition.     Rudene Anda, PA-C 05/02/13 1314

## 2013-05-01 NOTE — ED Notes (Signed)
Patient reports that she has not slept 4 nights and so she took 6 pills that consisted of trazadone and klonpine. Patient reports that she was not trying to overdose but get some sleep. Patient reports that she has family and financial stressors that has been keeping her up. Patient reports that she has 2 children 1 son age 34 and 1 daughter age 79. Patient reports that her children are doing well. Patient reports that her family stressors are with her live in boyfriend. Patient reports that she has family support her mother and sister. Patient denies SI, HI and AVH. Patient contracts for safety at this time.

## 2013-05-01 NOTE — ED Notes (Signed)
No acute distress noted.

## 2013-05-02 ENCOUNTER — Encounter (HOSPITAL_COMMUNITY): Payer: Self-pay

## 2013-05-02 ENCOUNTER — Inpatient Hospital Stay (HOSPITAL_COMMUNITY)
Admission: AD | Admit: 2013-05-02 | Discharge: 2013-05-03 | DRG: 885 | Disposition: A | Discharge status: Home / Self Care | Payer: BC Managed Care – PPO | Source: Intra-hospital | Attending: Psychiatry | Admitting: Psychiatry

## 2013-05-02 DIAGNOSIS — F411 Generalized anxiety disorder: Secondary | ICD-10-CM | POA: Diagnosis present

## 2013-05-02 DIAGNOSIS — F329 Major depressive disorder, single episode, unspecified: Principal | ICD-10-CM | POA: Diagnosis present

## 2013-05-02 MED ORDER — ACETAMINOPHEN 325 MG PO TABS
650.0000 mg | ORAL_TABLET | Freq: Four times a day (QID) | ORAL | Status: DC | PRN
Start: 2013-05-02 — End: 2013-05-03

## 2013-05-02 MED ORDER — CLONAZEPAM 1 MG PO TABS: 2.0000 mg | ORAL_TABLET | Freq: Every evening | ORAL | Status: DC | PRN

## 2013-05-02 MED ORDER — ALUM & MAG HYDROXIDE-SIMETH 200-200-20 MG/5ML PO SUSP: 30.0000 mL | ORAL | Status: DC | PRN

## 2013-05-02 MED ORDER — MAGNESIUM HYDROXIDE 400 MG/5ML PO SUSP: 30.0000 mL | Freq: Every day | ORAL | Status: DC | PRN

## 2013-05-02 MED ORDER — QUETIAPINE FUMARATE 50 MG PO TABS
50.0000 mg | ORAL_TABLET | Freq: Every evening | ORAL | Status: DC | PRN
  Administered 2013-05-02: 50 mg via ORAL
  Filled 2013-05-02 (×6): qty 1

## 2013-05-02 MED ORDER — QUETIAPINE FUMARATE 50 MG PO TABS: 50.0000 mg | ORAL_TABLET | Freq: Every evening | ORAL | Status: DC | PRN

## 2013-05-02 MED ORDER — TRAZODONE HCL 50 MG PO TABS: 50.0000 mg | ORAL_TABLET | Freq: Every evening | ORAL | Status: DC | PRN

## 2013-05-02 MED ORDER — TRAZODONE HCL 150 MG PO TABS
150.0000 mg | ORAL_TABLET | Freq: Every day | ORAL | Status: DC
  Filled 2013-05-02 (×3): qty 1

## 2013-05-02 NOTE — Progress Notes (Signed)
Patient ID: Mary Joyce, female   DOB: 1980/07/04, 32 y.o.   MRN: [id number]  D: Patient has a flat affect this am. Completed self inventory and gave depression "2" and hopelessness "1". Currently denies any SI.  A: Staff will monitor on q 15 minute checks, follow treatment plan, and give meds as ordered. R: No morning medication ordered.
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
## 2013-05-02 NOTE — Tx Team (Signed)
Initial Interdisciplinary Treatment Plan  PATIENT STRENGTHS: (choose at least two) Average or above average intelligence General fund of knowledge Supportive family/friends  PATIENT STRESSORS: Financial difficulties   PROBLEM LIST: Problem List/Patient Goals Date to be addressed Date deferred Reason deferred Estimated date of resolution  depression      anxiety      Increased risk for SI                                           DISCHARGE CRITERIA:  Improved stabilization in mood, thinking, and/or behavior Motivation to continue treatment in a less acute level of care Need for constant or close observation no longer present Reduction of life-threatening or endangering symptoms to within safe limits Verbal commitment to aftercare and medication compliance  PRELIMINARY DISCHARGE PLAN: Attend PHP/IOP Outpatient therapy  PATIENT/FAMIILY INVOLVEMENT: This treatment plan has been presented to and reviewed with the patient, Mary Joyce.  The patient and family have been given the opportunity to ask questions and make suggestions.  Daily Doe A 05/02/2013, 7:16 AM
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
## 2013-05-02 NOTE — [facility] Group Notes (Signed)
[facility] Group Notes:  (Nursing/MHT/Case Management/Adjunct)  Date:  05/02/2013  Time:  0915  Type of Therapy:  Psychoeducational Skills  Participation Level:  Active  Participation Quality:  Appropriate  Affect:  Appropriate  Cognitive:  Alert  Insight:  Appropriate  Engagement in Group:  Limited  Modes of Intervention:  Support  Summary of Progress/Problems:Morning Wellness group  Manuela Schwartz [facility] 05/02/2013, 10:49 AM

## 2013-05-02 NOTE — ED Notes (Signed)
No acute distress noted.

## 2013-05-03 DIAGNOSIS — F329 Major depressive disorder, single episode, unspecified: Secondary | ICD-10-CM

## 2013-05-03 MED ORDER — QUETIAPINE FUMARATE 50 MG PO TABS: ORAL_TABLET | ORAL | Status: DC

## 2013-05-03 NOTE — Progress Notes (Signed)
[facility] MD Progress Note  05/03/2013 1:49 PM Mary Joyce  MRN:  [id number] Subjective:  HPI Comments: Mary Joyce is  a 32 y/o female with no past psychiatric history, though she did report some depression while pregnant.  She does endorse periods of depression secondary to not finding a job, but denies ever being suicidal.   . Location: The patient reports she was not able to sleep.  . Quality: The patient reports that her main stressors are:  "worried about bills and looking for another job." "has not slept for 5 days."  In the area of affective symptoms, patient appears mildly anxious. Patient denies current suicidal ideation, intent, or plan. Patient denies current homicidal ideation, intent, or plan. Patient denies auditory hallucinations. Patient denies visual hallucinations. Patient denies symptoms of paranoia. Patient states sleep is poor without medications.  Appetite is good. Energy level is good despite not sleeping. Patient denies symptoms of anhedonia. Patient denies hopelessness, helplessness, or guilt.   . Severity: Mild . Duration: Insomnia for years since the age of 62-9,   . Timing: Mood worse throughout the day, if she does not sleep.  . Context: Obsessing about things.   . Modifying factors: Improves with being able to sleep.  . Associated signs and symptoms : Denies any recent episodes consistent with mania, particularly decreased need for sleep with increased energy, grandiosity, impulsivity, hyperverbal and pressured speech, or increased productivity. Denies any recent symptoms consistent with psychosis, particularly auditory or visual hallucinations, thought broadcasting/insertion/withdrawal, or ideas of reference. Also denies excessive worry to the point of physical symptoms as well as any panic attacks. Denies any history of trauma or symptoms consistent with PTSD such as flashbacks, nightmares, hypervigilance, feelings of numbness or inability to connect with others.    Called the patient's boyfriend.  He reports they had a fight about concerns of infidelity.  The patient has had stress about losing her job and the bills.  He reports that he does not know her to have suicidal thoughts.  He does endorse that the patient has a long history of problems with insomnia. He does endorse that the patient has trust issues.    Depressive Disorders: Depressive disorder NOS  AXIS I:  Depressive disorder NOS  AXIS II: No Diagnosis AXIS III:  Past Medical History   Diagnosis  Date   .  Anemia    .  Anxiety    .  History of uterine fibroid    .  History of keloid of skin    .  H/O menorrhagia    .  Depression     AXIS IV: occupational problems and other psychosocial or environmental problems  AXIS V: 61-70 mild symptoms    ADL's:  Intact  Sleep: Fair  Appetite:  Good  Suicidal Ideation:  Plan:  Patient denies. Intent:  Patient denies. Means:  Patient denies. Homicidal Ideation:  Plan:  Patient denies. Intent:  Patient denies. Means:  Patient denies.  AEB (as evidenced by):  Psychiatric Specialty Exam: ROS  Blood pressure 117/84, pulse 101, temperature 98.7 F (37.1 C), temperature source Oral, resp. rate 16, height 5' 4.5" (1.638 m), weight 82.555 kg (182 lb).Body mass index is 30.77 kg/(m^2).  General Appearance: Casual and Well Groomed  Eye Contact::  Good  Speech:  Clear and Coherent and Normal Rate  Volume:  Normal  Mood:  "good"  Affect:  Appropriate, Congruent and Full Range  Thought Process:  Coherent  Orientation:  Full (Time, Place, and Person)  Thought  Content:  WDL  Suicidal Thoughts:  No  Homicidal Thoughts:  No  Memory:  Immediate;   Good Recent;   Good Remote;   Good  Judgement:  Good  Insight:  Fair  Psychomotor Activity:  Normal  Concentration:  Good  Recall:  Fair  Akathisia:  Negative  Handed:  Right  AIMS (if indicated):   As noted in chart  Assets:  Communication Skills Desire for  Improvement Housing Intimacy Social Support Vocational/Educational  Sleep:  Number of Hours: 5.75   Current Medications: Current Facility-Administered Medications  Medication Dose Route Frequency Provider Last Rate Last Dose  . acetaminophen (TYLENOL) tablet 650 mg  650 mg Oral Q6H PRN Kerry Hough, PA-C      . alum & mag hydroxide-simeth (MAALOX/MYLANTA) 200-200-20 MG/5ML suspension 30 mL  30 mL Oral Q4H PRN Kerry Hough, PA-C      . magnesium hydroxide (MILK OF MAGNESIA) suspension 30 mL  30 mL Oral Daily PRN Kerry Hough, PA-C      . QUEtiapine (SEROQUEL) tablet 50 mg  50 mg Oral QHS,MR X 1 Verne Spurr, PA-C   50 mg at 05/02/13 2215    Lab Results:  Results for orders placed during the hospital encounter of 05/01/13 (from the past 48 hour(s))  CBC     Status: Abnormal   Collection Time    05/01/13  4:06 PM      Result Value Range   WBC 8.4  4.0 - 10.5 K/uL   RBC 4.96  3.87 - 5.11 MIL/uL   Hemoglobin 12.5  12.0 - 15.0 g/dL   HCT 16.1  09.6 - 04.5 %   MCV 73.8 (*) 78.0 - 100.0 fL   MCH 25.2 (*) 26.0 - 34.0 pg   MCHC 34.2  30.0 - 36.0 g/dL   RDW 40.9  81.1 - 91.4 %   Platelets 340  150 - 400 K/uL  COMPREHENSIVE METABOLIC PANEL     Status: Abnormal   Collection Time    05/01/13  4:06 PM      Result Value Range   Sodium 137  135 - 145 mEq/L   Potassium 3.5  3.5 - 5.1 mEq/L   Chloride 102  96 - 112 mEq/L   CO2 24  19 - 32 mEq/L   Glucose, Bld 105 (*) 70 - 99 mg/dL   BUN 8  6 - 23 mg/dL   Creatinine, Ser 7.82  0.50 - 1.10 mg/dL   Calcium 9.4  8.4 - 95.6 mg/dL   Total Protein 8.3  6.0 - 8.3 g/dL   Albumin 4.5  3.5 - 5.2 g/dL   AST 13  0 - 37 U/L   ALT 14  0 - 35 U/L   Alkaline Phosphatase 62  39 - 117 U/L   Total Bilirubin 0.6  0.3 - 1.2 mg/dL   GFR calc non Af Amer >90  >90 mL/min   GFR calc Af Amer >90  >90 mL/min   Comment: (NOTE)     The eGFR has been calculated using the CKD EPI equation.     This calculation has not been validated in all clinical  situations.     eGFR's persistently <90 mL/min signify possible Chronic Kidney     Disease.  ETHANOL     Status: None   Collection Time    05/01/13  4:06 PM      Result Value Range   Alcohol, Ethyl (B) <11  0 - 11 mg/dL  Comment:            LOWEST DETECTABLE LIMIT FOR     SERUM ALCOHOL IS 11 mg/dL     FOR MEDICAL PURPOSES ONLY  ACETAMINOPHEN LEVEL     Status: None   Collection Time    05/01/13  4:06 PM      Result Value Range   Acetaminophen (Tylenol), Serum <15.0  10 - 30 ug/mL   Comment:            THERAPEUTIC CONCENTRATIONS VARY     SIGNIFICANTLY. A RANGE OF 10-30     ug/mL MAY BE AN EFFECTIVE     CONCENTRATION FOR MANY PATIENTS.     HOWEVER, SOME ARE BEST TREATED     AT CONCENTRATIONS OUTSIDE THIS     RANGE.     ACETAMINOPHEN CONCENTRATIONS     >150 ug/mL AT 4 HOURS AFTER     INGESTION AND >50 ug/mL AT 12     HOURS AFTER INGESTION ARE     OFTEN ASSOCIATED WITH TOXIC     REACTIONS.  SALICYLATE LEVEL     Status: Abnormal   Collection Time    05/01/13  4:06 PM      Result Value Range   Salicylate Lvl <2.0 (*) 2.8 - 20.0 mg/dL  URINE RAPID DRUG SCREEN (HOSP PERFORMED)     Status: None   Collection Time    05/01/13  4:28 PM      Result Value Range   Opiates NONE DETECTED  NONE DETECTED   Cocaine NONE DETECTED  NONE DETECTED   Benzodiazepines NONE DETECTED  NONE DETECTED   Amphetamines NONE DETECTED  NONE DETECTED   Tetrahydrocannabinol NONE DETECTED  NONE DETECTED   Barbiturates NONE DETECTED  NONE DETECTED   Comment:            DRUG SCREEN FOR MEDICAL PURPOSES     ONLY.  IF CONFIRMATION IS NEEDED     FOR ANY PURPOSE, NOTIFY LAB     WITHIN 5 DAYS.                LOWEST DETECTABLE LIMITS     FOR URINE DRUG SCREEN     Drug Class       Cutoff (ng/mL)     Amphetamine      1000     Barbiturate      200     Benzodiazepine   200     Tricyclics       300     Opiates          300     Cocaine          300     THC              50  GLUCOSE, CAPILLARY     Status: None    Collection Time    05/01/13  4:34 PM      Result Value Range   Glucose-Capillary 88  70 - 99 mg/dL   Comment 1 Notify RN    POCT PREGNANCY, URINE     Status: None   Collection Time    05/01/13  4:57 PM      Result Value Range   Preg Test, Ur NEGATIVE  NEGATIVE   Comment:            THE SENSITIVITY OF THIS     METHODOLOGY IS >24 mIU/mL    Physical Findings: AIMS:  , ,  ,  ,  CIWA:    COWS:     Treatment Plan Summary: Will discharge patient today.  Plan: 1. Will continue seroquel 50 mg QHS.   Medical Decision Making Problem Points:  Established problem, stable/improving (1) and Review of psycho-social stressors (1) Data Points:  Order Aims Assessment (2) Review of medication regiment & side effects (2) Review of new medications or change in dosage (2)  I certify that inpatient services furnished can reasonably be expected to improve the patient's condition.   Mary Joyce 05/03/2013, 1:49 PM
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
## 2013-05-03 NOTE — Progress Notes (Signed)
Adult Psychoeducational Group Note  Date:  05/03/2013 Time:  1:15PM  Group Topic/Focus:  Therapeutic Activity  Participation Level:  Did Not Attend   Additional Comments:  Pt opted not to attend the group session.   Zacarias Pontes R 05/03/2013, 2:25 PM
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
## 2013-05-03 NOTE — ED Provider Notes (Signed)
Medical screening examination/treatment/procedure(s) were performed by non-physician practitioner and as supervising physician I was immediately available for consultation/collaboration.  EKG Interpretation    Date/Time:  Wednesday May 01 2013 15:46:06 EST Ventricular Rate:  80 PR Interval:  157 QRS Duration: 86 QT Interval:  420 QTC Calculation: 484 R Axis:   27 Text Interpretation:  Sinus rhythm Low voltage, precordial leads Borderline prolonged QT interval ED PHYSICIAN INTERPRETATION AVAILABLE IN [facility] Confirmed by TEST, RECORD ([id number]), editor CLAYTON  CCT  CETT, ROBIN (2) on 05/03/2013 7:15:59 AM             Ethelda Chick, MD 05/03/13 1506

## 2013-05-03 NOTE — Progress Notes (Signed)
Nrsg DC MD completed  DC order and DC SRA in pt's chart . Pt given DC AVS, signed consent for relaease in AVS, stated she understood  nthese instructions and will comply. SHe is given prescription for seroquel and all belongings returned to her as well. She was escorted to bldg entrance and DC'd after confirming for this writer that she denies SI, HI and / or presence of audit, vis and tactile halluc.

## 2013-05-03 NOTE — Progress Notes (Signed)
[facility] Adult Case Management Discharge Plan :  Will you be returning to the same living situation after discharge: Yes,  home At discharge, do you have transportation home?:Yes,  boyfriend Do you have the ability to pay for your medications:Yes,  mental health-pt reports that she does not have BCBS   Release of information consent forms completed and turned into medical records.  Patient to Follow up at: Follow-up Information   Follow up with [facility]. (Walk in between 8am-9am Monday through Friday for hospital followup/medication management/assessment for therapy services. )    Contact information:   [address], [place] [address] Phone: [phone] Fax: [phone]      Patient denies SI/HI:   Yes,  during admission/stay at [facility]    Safety Planning and Suicide Prevention discussed:  Yes,  SPE completed with pt's boyfriend. SPI pamphlet provided to pt and she was encouraged to share information with support network.  Smart, HeatherLCSWA  05/03/2013, 3:48 PM

## 2013-05-03 NOTE — [facility] Suicide Risk Assessment (Signed)
[facility] INPATIENT:  Family/Significant Other Suicide Prevention Education  Suicide Prevention Education:  Education Completed; Mary Joyce (Patient's boyfriend- [phone]) has been identified by the patient as the family member/significant other with whom the patient will be residing, and identified as the person(s) who will aid the patient in the event of a mental health crisis (suicidal ideations/suicide attempt).  With written consent from the patient, the family member/significant other has been provided the following suicide prevention education, prior to the and/or following the discharge of the patient.  The suicide prevention education provided includes the following:  Suicide risk factors  Suicide prevention and interventions  National Suicide Hotline telephone number  [facility] assessment telephone number  [facility] Emergency Assistance 911  [facility] and/or Residential Mobile Crisis Unit telephone number  Request made of family/significant other to:  Remove weapons (e.g., guns, rifles, knives), all items previously/currently identified as safety concern.    Remove drugs/medications (over-the-counter, prescriptions, illicit drugs), all items previously/currently identified as a safety concern.  The family member/significant other verbalizes understanding of the suicide prevention education information provided.  The family member/significant other agrees to remove the items of safety concern listed above.  Mary Joyce reported to [facility] that patient has been stressed about finances due to her unemployment. He stated that overall he has no concerns in regard to patient, as he reported that patient was not attempting to kill herself. Mary Joyce reported that he came to visit patient yesterday and has no additional concerns at this time. If patient is d/c Mary Joyce reports he will provide transportation at time of discharge.   PICKETT Joyce, Mary Buley C 05/03/2013,  11:41 AM

## 2013-05-03 NOTE — [facility] Suicide Risk Assessment (Signed)
Suicide Risk Assessment  Discharge Assessment     Demographic Factors:  Divorced or widowed  Mental Status Per Nursing Assessment::   On Admission:  NA  Current Mental Status by Physician: Psychiatric Specialty Exam:  ROS   Blood pressure 117/84, pulse 101, temperature 98.7 F (37.1 C), temperature source Oral, resp. rate 16, height 5' 4.5" (1.638 m), weight 82.555 kg (182 lb).Body mass index is 30.77 kg/(m^2).   General Appearance: Casual and Well Groomed   Eye Contact:: Good   Speech: Clear and Coherent and Normal Rate   Volume: Normal   Mood: "good"   Affect: Appropriate, Congruent and Full Range   Thought Process: Coherent   Orientation: Full (Time, Place, and Person)   Thought Content: WDL   Suicidal Thoughts: No   Homicidal Thoughts: No   Memory: Immediate; Good  Recent; Good  Remote; Good   Judgement: Good   Insight: Fair   Psychomotor Activity: Normal   Concentration: Good   Recall: Fair   Akathisia: Negative   Handed: Right   AIMS (if indicated): As noted in chart   Assets: Communication Skills  Desire for Improvement  Housing  Intimacy  Social Support  Vocational/Educational   Sleep: Number of Hours: 5.75    Loss Factors: Decrease in vocational status and Financial problems/change in socioeconomic status  Historical Factors: NA  Risk Reduction Factors:   Responsible for children under 28 years of age, Religious beliefs about death and Living with another person, especially a relative  Continued Clinical Symptoms:  None Cognitive Features That Contribute To Risk:  Closed-mindedness    Suicide Risk:  Minimal: No identifiable suicidal ideation.  Patients presenting with no risk factors but with morbid ruminations; may be classified as minimal risk based on the severity of the depressive symptoms  Discharge Diagnoses:   AXIS I: Depressive disorder NOS  AXIS II: No Diagnosis  AXIS III:  Past Medical History   Diagnosis  Date   .  Anemia    .   Anxiety    .  History of uterine fibroid    .  History of keloid of skin    .  H/O menorrhagia    .  Depression    AXIS IV: occupational problems and other psychosocial or environmental problems  AXIS V: 61-70 mild symptoms   Plan Of Care/Follow-up recommendations:  Activity:  Increase as tolerated Diet:  Regular Tests:  Advised patient about rountine labs required for being on seroquel.  Other:  Follow up with [facility].  Is patient on multiple antipsychotic therapies at discharge:  No   Has Patient had three or more failed trials of antipsychotic monotherapy by history:  No  Recommended Plan for Multiple Antipsychotic Therapies: NA  Kolbi Altadonna 05/03/2013, 2:44 PM

## 2013-05-07 NOTE — Progress Notes (Addendum)
Patient Discharge Instructions:  After Visit Summary (AVS):   Faxed to:  05/07/13 Discharge Summary Note:   Faxed to:  05/08/13 Psychiatric Admission Assessment Note:   Faxed to:  05/07/13 Suicide Risk Assessment - Discharge Assessment:   Faxed to:  05/07/13 Faxed/Sent to the Next Level Care provider:  05/07/13 Faxed to [facility] @ [phone]  Jerelene Redden, 05/07/2013, 4:21 PM

## 2013-05-09 ENCOUNTER — Telehealth (HOSPITAL_COMMUNITY): Payer: Self-pay | Admitting: Psychiatry

## 2013-05-09 NOTE — Telephone Encounter (Signed)
Erroneous encounter

## 2013-05-27 ENCOUNTER — Other Ambulatory Visit (HOSPITAL_COMMUNITY): Payer: Self-pay | Admitting: Physician Assistant

## 2014-07-07 ENCOUNTER — Telehealth: Payer: Self-pay

## 2014-07-07 NOTE — Telephone Encounter (Signed)
Spoke with pt, she was taking Seroquel and gained 25 pounds. She was working with another doctor due to her insurance changing but now she has new insurance and wants Dr. Patsy Lageropland to take over her care. She states she wanted to try Xanax because it has helped her and she wants to stop taking the seroquel. I advised pt to make an appt to discuss with Dr. Patsy Lageropland. Pt agreed and transferred to appts. Please advise/FYI

## 2014-07-07 NOTE — Telephone Encounter (Signed)
That is fine- I have not seen her since 02/2013 so would need to see her to make a plan.  Of note  She was hospitalized with a suicide attempt in November; likely will need to continue to involve a psychiatrist in her care

## 2014-07-07 NOTE — Telephone Encounter (Signed)
Dr.Copland, Pt would like to speak with you regarding her rx for seroquel   Best# [phone]

## 2014-07-31 ENCOUNTER — Ambulatory Visit (INDEPENDENT_AMBULATORY_CARE_PROVIDER_SITE_OTHER): Payer: BLUE CROSS/BLUE SHIELD | Admitting: Family Medicine

## 2014-07-31 VITALS — BP 126/88 | HR 62 | Temp 97.8°F | Resp 24 | Ht 66.5 in | Wt 207.4 lb

## 2014-07-31 DIAGNOSIS — R635 Abnormal weight gain: Secondary | ICD-10-CM

## 2014-07-31 DIAGNOSIS — E669 Obesity, unspecified: Secondary | ICD-10-CM

## 2014-07-31 DIAGNOSIS — Z1151 Encounter for screening for human papillomavirus (HPV): Secondary | ICD-10-CM

## 2014-07-31 DIAGNOSIS — Z0000 Encounter for general adult medical examination without abnormal findings: Secondary | ICD-10-CM

## 2014-07-31 DIAGNOSIS — Z113 Encounter for screening for infections with a predominantly sexual mode of transmission: Secondary | ICD-10-CM

## 2014-07-31 DIAGNOSIS — Z13220 Encounter for screening for lipoid disorders: Secondary | ICD-10-CM

## 2014-07-31 DIAGNOSIS — G4700 Insomnia, unspecified: Secondary | ICD-10-CM

## 2014-07-31 DIAGNOSIS — Z23 Encounter for immunization: Secondary | ICD-10-CM

## 2014-07-31 MED ORDER — QUETIAPINE FUMARATE ER 50 MG PO TB24: 100.0000 mg | ORAL_TABLET | Freq: Every day | ORAL | Status: DC

## 2014-07-31 NOTE — Progress Notes (Signed)
[facility] [address] [place] [address] [phone]  Date:  07/31/2014   Name:  Mary Joyce   DOB:  08-06-1980   MRN:  [id number]  PCP:  No PCP Per Patient    Chief Complaint: Annual Exam   History of Present Illness:  Mary Joyce is a 34 y.o. very pleasant female patient who presents with the following:  She is here today for a CPE.  I last saw her in 2014 at which time we discussed her insomnia among other concerns.  A few months later she was admitted to [facility] with a drug overdose/ possible suicide attempt.  She adamantly denies that she was trying to commit suicide, states that she had not slept in several days at that point and was trying to take medication to get to sleep.  Since February of last year she has been on seroquel.  She admits that she is sleeping well- better than ever- with this medication.  However she has gained weight which she really does not like.  She has been a psychiatry pt at [facility] but has not followed- up there in a while since she got insurance.   Wt Readings from Last 3 Encounters:  07/31/14 207 lb 6 oz (94.065 kg)  05/02/13 182 lb (82.555 kg)  02/28/13 186 lb 9.6 oz (84.641 kg)   She is trying to control her weight through diet and exercise and is really frustrated  She had a partial hyst (still hs both ovaries) in 2009 due to fibriouds- no history of GYN cancer. She did have an abnl pap many years ago but f/u was ok.   She would like a flu shot and tetanus shot today Last ate at breakfast time- seen at 5pm  Patient Active Problem List   Diagnosis Date Noted  . MDD (major depressive disorder) 05/02/2013  . Anxiety and depression 02/20/2013    Past Medical History  Diagnosis Date  . Anemia   . Anxiety   . History of uterine fibroid   . History of keloid of skin   . H/O menorrhagia   . Depression     Past Surgical History  Procedure Laterality Date  . Cesarean section    . Laparoscopic vaginal hysterectomy   2009  . Tubal ligation Bilateral 2001  . Hysteroscopy  2008    with resection of fibroid     History  Substance Use Topics  . Smoking status: Never Smoker   . Smokeless tobacco: Never Used  . Alcohol Use: No    Family History  Problem Relation Age of Onset  . Hypertension Mother   . Heart disease Father   . Diabetes      No Known Allergies  Medication list has been reviewed and updated.  No current outpatient prescriptions on file prior to visit.   No current facility-administered medications on file prior to visit.    Review of Systems:  As per HPI- otherwise negative.   Physical Examination: Filed Vitals:   07/31/14 1801  BP: 126/88  Pulse: 62  Temp: 97.8 F (36.6 C)  Resp: 24   Filed Vitals:   07/31/14 1801  Height: 5' 6.5" (1.689 m)  Weight: 207 lb 6 oz (94.065 kg)   Body mass index is 32.97 kg/(m^2). Ideal Body Weight: Weight in (lb) to have BMI = 25: 156.9  GEN: WDWN, NAD, Non-toxic, A & O x 3, obese, looks well HEENT: Atraumatic, Normocephalic. Neck supple. No masses, No LAD.  Bilateral TM wnl, oropharynx normal.  PEERL,EOMI.   Ears and Nose: No external deformity. CV: RRR, No M/G/R. No JVD. No thrill. No extra heart sounds. PULM: CTA B, no wheezes, crackles, rhonchi. No retractions. No resp. distress. No accessory muscle use. ABD: S, NT, ND. No rebound. No HSM. EXTR: No c/c/e NEURO Normal gait.  PSYCH: Normally interactive. Conversant. Not depressed or anxious appearing.  Calm demeanor.  Breast: normal exam, no masses/ dimpling/ discharge Pelvic: normal, no vaginal lesions or discharge. Cervix and uterus surgically absent, no adnexal tendereness or masses   Assessment and Plan: Physical exam - Plan: CBC  Screening for STD (sexually transmitted disease) - Plan: Pap IG, CT/NG NAA, and HPV (high risk), Hepatitis B surface antibody, Hepatitis B surface antigen, Hepatitis C antibody, HIV antibody, HSV(herpes simplex vrs) 1+2 ab-IgG,  RPR  Screening for hyperlipidemia - Plan: Lipid panel  Insomnia - Plan: QUEtiapine Fumarate (SEROQUEL XR) 50 MG TB24 24 hr tablet, Nocturnal polysomnography (NPSG)  Immunization due - Plan: Flu Vaccine QUAD 36+ mos IM, Tdap vaccine greater than or equal to 7yo IM  Screening for HPV (human papillomavirus) - Plan: Pap IG, CT/NG NAA, and HPV (high risk)  Weight gain - Plan: Comprehensive metabolic panel, TSH, Nocturnal polysomnography (NPSG)  STI screening, other labs per her request.  She has been told that she might have sleep apnea and would like to be tested Detailed discussion regarding her medication.  I understand that she probably was not trying to hurt herself in 2014, and that she is bothered by weight gain.  However explained that not sleeping for days is never normal, and she likely does need medication to keep this from happening again.  explained that I would prefer to have the input of a psychiatrist while dealing with antipsychotics.  She is eager to stop seroquel but understands that this may be the most effective class of medication for dealing with her insomnia and other possible mental health problems.  Will try decreasing her dose of seroquel to 100 mg, and she will [facility] for guidance.    Signed Abbe AmsterdamJessica Copland, MD

## 2014-07-31 NOTE — Patient Instructions (Addendum)
I will be in touch with your labs asap.   Go down to 100mg  of seroquel daily; however be sure to contact me if you cannot sleep again or have any other issues. [facility] input I will set you up for a sleep study as well

## 2014-08-01 LAB — LIPID PANEL
Cholesterol: 182 mg/dL (ref 0–200)
HDL: 36 mg/dL — ABNORMAL LOW (ref >=46)
LDL CALC: 122 mg/dL — AB (ref 0–99)
Total CHOL/HDL Ratio: 5.1 Ratio
Triglycerides: 121 mg/dL (ref <150)
VLDL: 24 mg/dL (ref 0–40)

## 2014-08-01 LAB — COMPREHENSIVE METABOLIC PANEL
ALBUMIN: 4.2 g/dL (ref 3.5–5.2)
ALK PHOS: 57 U/L (ref 39–117)
ALT: 14 U/L (ref 0–35)
AST: 12 U/L (ref 0–37)
BILIRUBIN TOTAL: 0.5 mg/dL (ref 0.2–1.2)
BUN: 8 mg/dL (ref 6–23)
CO2: 21 mEq/L (ref 19–32)
Calcium: 9.7 mg/dL (ref 8.4–10.5)
Chloride: 103 mEq/L (ref 96–112)
Creat: 0.5 mg/dL (ref 0.50–1.10)
Glucose, Bld: 122 mg/dL — ABNORMAL HIGH (ref 70–99)
POTASSIUM: 3.9 meq/L (ref 3.5–5.3)
SODIUM: 135 meq/L (ref 135–145)
TOTAL PROTEIN: 7.4 g/dL (ref 6.0–8.3)

## 2014-08-01 LAB — CBC
HCT: 34.9 % — ABNORMAL LOW (ref 36.0–46.0)
Hemoglobin: 11.7 g/dL — ABNORMAL LOW (ref 12.0–15.0)
MCH: 25.1 pg — AB (ref 26.0–34.0)
MCHC: 33.5 g/dL (ref 30.0–36.0)
MCV: 74.9 fL — ABNORMAL LOW (ref 78.0–100.0)
MPV: 9.6 fL (ref 8.6–12.4)
PLATELETS: 400 10*3/uL (ref 150–400)
RBC: 4.66 MIL/uL (ref 3.87–5.11)
RDW: 15.7 % — ABNORMAL HIGH (ref 11.5–15.5)
WBC: 5.8 10*3/uL (ref 4.0–10.5)

## 2014-08-01 LAB — HEPATITIS C ANTIBODY: HCV Ab: NEGATIVE

## 2014-08-01 LAB — RPR

## 2014-08-01 LAB — TSH: TSH: 2.722 u[IU]/mL (ref 0.350–4.500)

## 2014-08-01 LAB — HIV ANTIBODY (ROUTINE TESTING W REFLEX): HIV: NONREACTIVE

## 2014-08-01 LAB — HEPATITIS B SURFACE ANTIGEN: Hepatitis B Surface Ag: NEGATIVE

## 2014-08-01 LAB — HEPATITIS B SURFACE ANTIBODY, QUANTITATIVE: Hepatitis B-Post: 0.3 m[IU]/mL

## 2014-08-04 LAB — HSV(HERPES SIMPLEX VRS) I + II AB-IGG
HSV 1 Glycoprotein G Ab, IgG: 0.31 IV
HSV 2 Glycoprotein G Ab, IgG: <0.1 IV

## 2014-08-05 ENCOUNTER — Encounter: Payer: Self-pay | Admitting: Family Medicine

## 2014-08-05 LAB — PAP IG, CT-NG NAA, HPV HIGH-RISK
Chlamydia Probe Amp: NEGATIVE
GC Probe Amp: NEGATIVE
HPV DNA HIGH RISK: NOT DETECTED

## 2014-10-03 ENCOUNTER — Institutional Professional Consult (permissible substitution): Payer: BLUE CROSS/BLUE SHIELD | Admitting: Neurology

## 2014-10-31 ENCOUNTER — Institutional Professional Consult (permissible substitution): Payer: BLUE CROSS/BLUE SHIELD | Admitting: Neurology

## 2014-12-09 ENCOUNTER — Other Ambulatory Visit: Payer: Self-pay | Admitting: Family Medicine

## 2015-01-06 ENCOUNTER — Other Ambulatory Visit: Payer: Self-pay | Admitting: Family Medicine

## 2015-02-03 ENCOUNTER — Other Ambulatory Visit: Payer: Self-pay | Admitting: Physician Assistant

## 2015-02-12 ENCOUNTER — Emergency Department (HOSPITAL_COMMUNITY)
Admission: EM | Admit: 2015-02-12 | Discharge: 2015-02-12 | Disposition: A | Discharge status: Home / Self Care | Payer: BLUE CROSS/BLUE SHIELD | Attending: Emergency Medicine | Admitting: Emergency Medicine

## 2015-02-12 ENCOUNTER — Encounter (HOSPITAL_COMMUNITY): Payer: Self-pay | Admitting: Emergency Medicine

## 2015-02-12 DIAGNOSIS — Z86018 Personal history of other benign neoplasm: Secondary | ICD-10-CM | POA: Insufficient documentation

## 2015-02-12 DIAGNOSIS — Z8742 Personal history of other diseases of the female genital tract: Secondary | ICD-10-CM | POA: Insufficient documentation

## 2015-02-12 DIAGNOSIS — F329 Major depressive disorder, single episode, unspecified: Secondary | ICD-10-CM | POA: Diagnosis not present

## 2015-02-12 DIAGNOSIS — M546 Pain in thoracic spine: Secondary | ICD-10-CM | POA: Diagnosis not present

## 2015-02-12 DIAGNOSIS — F419 Anxiety disorder, unspecified: Secondary | ICD-10-CM | POA: Insufficient documentation

## 2015-02-12 DIAGNOSIS — Z872 Personal history of diseases of the skin and subcutaneous tissue: Secondary | ICD-10-CM | POA: Insufficient documentation

## 2015-02-12 DIAGNOSIS — M545 Low back pain: Secondary | ICD-10-CM | POA: Diagnosis present

## 2015-02-12 DIAGNOSIS — Z862 Personal history of diseases of the blood and blood-forming organs and certain disorders involving the immune mechanism: Secondary | ICD-10-CM | POA: Insufficient documentation

## 2015-02-12 MED ORDER — NAPROXEN 375 MG PO TABS: 375.0000 mg | ORAL_TABLET | Freq: Three times a day (TID) | ORAL | Status: DC

## 2015-02-12 MED ORDER — NAPROXEN 375 MG PO TABS
375.0000 mg | ORAL_TABLET | Freq: Once | ORAL | Status: AC
  Administered 2015-02-12: 375 mg via ORAL
  Filled 2015-02-12: qty 1

## 2015-02-12 NOTE — ED Notes (Addendum)
Pt presents co worsening pain in lower back 7/10 that's been occuring off and on for about 2 weeks; denies radiation; pt states she tried OTC meds and heating pads w/o relief; pt denies falls nor heavy lifting; denies flank pain nor urinary sx;

## 2015-02-12 NOTE — Discharge Instructions (Signed)
Back Pain, Adult °Low back pain is very common. About 1 in 5 people have back pain. The cause of low back pain is rarely dangerous. The pain often gets better over time. About half of people with a sudden onset of back pain feel better in just 2 weeks. About 8 in 10 people feel better by 6 weeks.  °CAUSES °Some common causes of back pain include: °· Strain of the muscles or ligaments supporting the spine. °· Wear and tear (degeneration) of the spinal discs. °· Arthritis. °· Direct injury to the back. °DIAGNOSIS °Most of the time, the direct cause of low back pain is not known. However, back pain can be treated effectively even when the exact cause of the pain is unknown. Answering your caregiver's questions about your overall health and symptoms is one of the most accurate ways to make sure the cause of your pain is not dangerous. If your caregiver needs more information, he or she may order lab work or imaging tests (X-rays or MRIs). However, even if imaging tests show changes in your back, this usually does not require surgery. °HOME CARE INSTRUCTIONS °For many people, back pain returns. Since low back pain is rarely dangerous, it is often a condition that people can learn to manage on their own.  °· Remain active. It is stressful on the back to sit or stand in one place. Do not sit, drive, or stand in one place for more than 30 minutes at a time. Take short walks on level surfaces as soon as pain allows. Try to increase the length of time you walk each day. °· Do not stay in bed. Resting more than 1 or 2 days can delay your recovery. °· Do not avoid exercise or work. Your body is made to move. It is not dangerous to be active, even though your back may hurt. Your back will likely heal faster if you return to being active before your pain is gone. °· Pay attention to your body when you  bend and lift. Many people have less discomfort when lifting if they bend their knees, keep the load close to their bodies, and  avoid twisting. Often, the most comfortable positions are those that put less stress on your recovering back. °· Find a comfortable position to sleep. Use a firm mattress and lie on your side with your knees slightly bent. If you lie on your back, put a pillow under your knees. °· Only take over-the-counter or prescription medicines as directed by your caregiver. Over-the-counter medicines to reduce pain and inflammation are often the most helpful. Your caregiver may prescribe muscle relaxant drugs. These medicines help dull your pain so you can more quickly return to your normal activities and healthy exercise. °· Put ice on the injured area. °¨ Put ice in a plastic bag. °¨ Place a towel between your skin and the bag. °¨ Leave the ice on for 15-20 minutes, 03-04 times a day for the first 2 to 3 days. After that, ice and heat may be alternated to reduce pain and spasms. °· Ask your caregiver about trying back exercises and gentle massage. This may be of some benefit. °· Avoid feeling anxious or stressed. Stress increases muscle tension and can worsen back pain. It is important to recognize when you are anxious or stressed and learn ways to manage it. Exercise is a great option. °SEEK MEDICAL CARE IF: °· You have pain that is not relieved with rest or medicine. °· You have pain that does not improve in 1 week. °· You have new symptoms. °· You are generally not feeling well. °SEEK   IMMEDIATE MEDICAL CARE IF:   You have pain that radiates from your back into your legs.  You develop new bowel or bladder control problems.  You have unusual weakness or numbness in your arms or legs.  You develop nausea or vomiting.  You develop abdominal pain.  You feel faint. Document Released: 05/23/2005 Document Revised: 11/22/2011 Document Reviewed: 09/24/2013 [facility] Patient Information 2015 [place], [place]. This information is not intended to replace advice given to you by your health care provider. Make sure you  discuss any questions you have with your health care provider. You have sharp, left-sided thoracic back pain.  I would like you to take an anti-inflammatory regular basis.  Return times a day for a week make an appointment with your primary care physician if you still have discomfort after that time

## 2015-02-12 NOTE — ED Provider Notes (Signed)
CSN: [id number]     Arrival date & time 02/12/15  2044 History  This chart was scribed for non-physician practitioner, Arman Filter, NP, working with Linwood Dibbles, MD, by Budd Palmer ED Scribe. This patient was seen in room WTR5/WTR5 and the patient's care was started at 9:11 PM     Chief Complaint  Patient presents with  . Back Pain   The history is provided by the patient. No language interpreter was used.   HPI Comments: Mary Joyce is a 34 y.o. female who presents to the Emergency Department complaining of intermittent, worsening, non-radiating, lower back pain onset 2 weeks ago while at rest. She notes sharp, stabbing pain on the left side, and aching pain on the right. She currently rates her pain at 7/10. She states each episode lasts for about a minute, to the point where she cannot move, then subsides back to a dull ache. She notes no triggers to the pain. She has tried several OTC medications intermittently, with no relief. She states she She has not seen her PCP about this. She has a PSHx of hysterectomy. Pt denies taking BCP, recent travel, and leg swelling.  Past Medical History  Diagnosis Date  . Anemia   . Anxiety   . History of uterine fibroid   . History of keloid of skin   . H/O menorrhagia   . Depression    Past Surgical History  Procedure Laterality Date  . Cesarean section    . Laparoscopic vaginal hysterectomy  2009  . Tubal ligation Bilateral 2001  . Hysteroscopy  2008    with resection of fibroid    Family History  Problem Relation Age of Onset  . Hypertension Mother   . Heart disease Father   . Diabetes     Social History  Substance Use Topics  . Smoking status: Never Smoker   . Smokeless tobacco: Never Used  . Alcohol Use: No   OB History    No data available     Review of Systems  Unable to perform ROS Constitutional: Negative for fever and chills.  Respiratory: Negative for shortness of breath.   Cardiovascular: Negative for chest pain.   Genitourinary: Negative for dysuria.  Musculoskeletal: Positive for back pain. Negative for neck pain and neck stiffness.  Skin: Negative for rash and wound.  Neurological: Negative for dizziness and headaches.  Psychiatric/Behavioral: The patient is nervous/anxious.   All other systems reviewed and are negative.   Allergies  Review of patient's allergies indicates no known allergies.  Home Medications   Prior to Admission medications   Medication Sig Start Date End Date Taking? Authorizing Provider  naproxen (NAPROSYN) 375 MG tablet Take 1 tablet (375 mg total) by mouth 3 (three) times daily with meals. 02/12/15   Earley Favor, NP  QUEtiapine (SEROQUEL XR) 50 MG TB24 24 hr tablet TAKE 2 TABLETS BY MOUTH EVERY NIGHT AT BEDTIME.  "NO MORE REFILLS WITHOUT OV" 02/03/15   Chelle Jeffery, PA-C   BP 121/90 mmHg  Pulse 60  Temp(Src) 98.7 F (37.1 C) (Oral)  SpO2 100% Physical Exam  Constitutional: She is oriented to person, place, and time. She appears well-developed and well-nourished.  HENT:  Head: Normocephalic.  Mouth/Throat: Oropharynx is clear and moist.  Eyes: Pupils are equal, round, and reactive to light.  Neck: Normal range of motion. No spinous process tenderness and no muscular tenderness present.  Cardiovascular: Normal rate.   Pulmonary/Chest: Effort normal. No respiratory distress. She has no wheezes. She  exhibits no tenderness.  Abdominal: Soft.  Musculoskeletal: Normal range of motion. She exhibits no edema or tenderness.  Neurological: She is alert and oriented to person, place, and time.  Skin: Skin is warm. No rash noted.  Nursing note and vitals reviewed.   ED Course  Procedures  DIAGNOSTIC STUDIES: Oxygen Saturation is 100% on RA, normal by my interpretation.    COORDINATION OF CARE: 9:14 PM - Discussed plans to order a muscle relaxant that does not interfere with her Seroquel. Pt advised of plan for treatment and pt agrees.  Labs Review Labs Reviewed - No  data to display  Imaging Review No results found. I have personally reviewed and evaluated these images and lab results as part of my medical decision-making.   EKG Interpretation None    patient was put on Naprosyn on a regular basis 3 times a day for a week to follow-up with her primary care physician if she still continues to have discomfort.  I did not put her on a muscle relaxer, as this will worsen the somnolence from her Seroquel  MDM   Final diagnoses:  Left-sided thoracic back pain    I personally performed the services described in this documentation, which was scribed in my presence. The recorded information has been reviewed and is accurate.  Earley Favor, NP 02/12/15 2123  Linwood Dibbles, MD 02/12/15 2203

## 2015-03-10 ENCOUNTER — Other Ambulatory Visit: Payer: Self-pay | Admitting: Physician Assistant

## 2015-03-12 ENCOUNTER — Ambulatory Visit (INDEPENDENT_AMBULATORY_CARE_PROVIDER_SITE_OTHER): Payer: BLUE CROSS/BLUE SHIELD | Admitting: Family Medicine

## 2015-03-12 ENCOUNTER — Other Ambulatory Visit: Payer: Self-pay | Admitting: Family Medicine

## 2015-03-12 ENCOUNTER — Telehealth: Payer: Self-pay

## 2015-03-12 VITALS — BP 110/70 | HR 71 | Temp 98.9°F | Resp 18 | Ht 66.5 in | Wt 207.0 lb

## 2015-03-12 DIAGNOSIS — F321 Major depressive disorder, single episode, moderate: Secondary | ICD-10-CM

## 2015-03-12 DIAGNOSIS — Z5181 Encounter for therapeutic drug level monitoring: Secondary | ICD-10-CM

## 2015-03-12 DIAGNOSIS — D509 Iron deficiency anemia, unspecified: Secondary | ICD-10-CM | POA: Diagnosis not present

## 2015-03-12 LAB — CBC
HEMATOCRIT: 33.7 % — AB (ref 36.0–46.0)
Hemoglobin: 11.6 g/dL — ABNORMAL LOW (ref 12.0–15.0)
MCH: 25.3 pg — AB (ref 26.0–34.0)
MCHC: 34.4 g/dL (ref 30.0–36.0)
MCV: 73.4 fL — ABNORMAL LOW (ref 78.0–100.0)
MPV: 9.3 fL (ref 8.6–12.4)
Platelets: 387 10*3/uL (ref 150–400)
RBC: 4.59 MIL/uL (ref 3.87–5.11)
RDW: 15.5 % (ref 11.5–15.5)
WBC: 6.2 10*3/uL (ref 4.0–10.5)

## 2015-03-12 LAB — HEMOGLOBIN A1C
Hgb A1c MFr Bld: 5.9 % — ABNORMAL HIGH (ref <5.7)
MEAN PLASMA GLUCOSE: 123 mg/dL — AB (ref <117)

## 2015-03-12 LAB — LDL CHOLESTEROL, DIRECT: Direct LDL: 116 mg/dL (ref <130)

## 2015-03-12 MED ORDER — QUETIAPINE FUMARATE ER 50 MG PO TB24: ORAL_TABLET | ORAL | Status: DC

## 2015-03-12 NOTE — Telephone Encounter (Signed)
Pt is here to be seen now. 

## 2015-03-12 NOTE — Telephone Encounter (Signed)
[phone]    Med refill until she can see Dr. Patsy Lager.

## 2015-03-12 NOTE — Progress Notes (Signed)
[facility] [address], [place] [address] [phone]  Date:  03/12/2015   Name:  Mary Joyce   DOB:  Mar 23, 1981   MRN:  [id number]  PCP:  No PCP Per Patient    Chief Complaint: Medication Refill   History of Present Illness:  Mary Joyce is a 34 y.o. very pleasant female patient who presents with the following:  Here today for a medication refill and monitoring Last seen in February of this year for CPE- at that time we had decreased her seroquel to  from 150.   She noted that the medication was working well for her insomnia but she did not like the weight gain.  She reports that this is doing well with this dose. She has not lost weight but is also not gaining any longer She takes just  on the weekends when sleep is less essential and still does ok  She feels that she is doing well overall  Labs in February of this year She just ate a couple of hours ago  Patient Active Problem List   Diagnosis Date Noted  . Obesity 07/31/2014  . MDD (major depressive disorder) (HCC) 05/02/2013  . Anxiety and depression 02/20/2013    Past Medical History  Diagnosis Date  . Anemia   . Anxiety   . History of uterine fibroid   . History of keloid of skin   . H/O menorrhagia   . Depression     Past Surgical History  Procedure Laterality Date  . Cesarean section    . Laparoscopic vaginal hysterectomy  2009  . Tubal ligation Bilateral 2001  . Hysteroscopy  2008    with resection of fibroid     Social History  Substance Use Topics  . Smoking status: Never Smoker   . Smokeless tobacco: Never Used  . Alcohol Use: No    Family History  Problem Relation Age of Onset  . Hypertension Mother   . Heart disease Father   . Diabetes      No Known Allergies  Medication list has been reviewed and updated.  Current Outpatient Prescriptions on File Prior to Visit  Medication Sig Dispense Refill  . QUEtiapine (SEROQUEL XR) 50 MG TB24 24 hr  tablet TAKE 2 TABLETS BY MOUTH EVERY NIGHT AT BEDTIME.  "NO MORE REFILLS WITHOUT OV" 60 tablet 0  . naproxen (NAPROSYN) 375 MG tablet Take 1 tablet (375 mg total) by mouth 3 (three) times daily with meals. (Patient not taking: Reported on 03/12/2015) 30 tablet 0   No current facility-administered medications on file prior to visit.    Review of Systems:  As per HPI- otherwise negative.   Physical Examination: Filed Vitals:   03/12/15 1327  BP: 110/70  Pulse: 71  Temp: 98.9 F (37.2 C)  Resp: 18   Filed Vitals:   03/12/15 1327  Height: 5' 6.5" (1.689 m)  Weight: 207 lb (93.895 kg)   Body mass index is 32.91 kg/(m^2). Ideal Body Weight: Weight in (lb) to have BMI = 25: 156.9  GEN: WDWN, NAD, Non-toxic, A & O x 3,obese/ large build, looks well HEENT: Atraumatic, Normocephalic. Neck supple. No masses, No LAD. Ears and Nose: No external deformity. CV: RRR, No M/G/R. No JVD. No thrill. No extra heart sounds. PULM: CTA B, no wheezes, crackles, rhonchi. No retractions. No resp. distress. No accessory muscle use. EXTR: No c/c/e NEURO Normal gait.  PSYCH: Normally interactive. Conversant. Not depressed or anxious appearing.  Calm  demeanor.   Wt Readings from Last 3 Encounters:  03/12/15 207 lb (93.895 kg)  07/31/14 207 lb 6 oz (94.065 kg)  05/02/13 182 lb (82.555 kg)    Assessment and Plan: Moderate single current episode of major depressive disorder (HCC) - Plan: QUEtiapine (SEROQUEL XR) 50 MG TB24 24 hr tablet  Medication monitoring encounter - Plan: CBC, Hemoglobin A1c, LDL cholesterol, direct  Refilled her seroquel - await labs and will be in touch with her She has been able to decrease her seroquel to 100 mg without problems    Signed Abbe Amsterdam, MD

## 2015-03-12 NOTE — Patient Instructions (Signed)
I will be in touch with your labs Please check in with me in about 6 months (email is fine) and let me know how you are doing

## 2015-03-13 LAB — FERRITIN: Ferritin: 180 ng/mL (ref 10–291)

## 2015-04-21 ENCOUNTER — Encounter: Payer: Self-pay | Admitting: Neurology

## 2015-04-21 ENCOUNTER — Ambulatory Visit (INDEPENDENT_AMBULATORY_CARE_PROVIDER_SITE_OTHER): Payer: BLUE CROSS/BLUE SHIELD | Admitting: Neurology

## 2015-04-21 VITALS — BP 118/80 | HR 76 | Resp 16 | Ht 66.5 in | Wt 203.0 lb

## 2015-04-21 DIAGNOSIS — R0683 Snoring: Secondary | ICD-10-CM

## 2015-04-21 DIAGNOSIS — R351 Nocturia: Secondary | ICD-10-CM | POA: Diagnosis not present

## 2015-04-21 DIAGNOSIS — G4719 Other hypersomnia: Secondary | ICD-10-CM | POA: Diagnosis not present

## 2015-04-21 NOTE — Progress Notes (Signed)
Subjective:    Patient ID: Mary Joyce is a 34 y.o. female.  HPI     Huston Foley, MD, PhD [facility] [address], [address] [address] [place], [place] [address]  Dear Dr. Patsy Lager,   I saw your patient, Mary Joyce, upon your kind request, in my neurologic clinic today for initial consultation of her sleep disorder, in particular, concern for obstructive sleep apnea. The patient is unaccompanied today. As you know, Mary Joyce is a 34 year old right-handed woman with an underlying medical history of depression, anxiety, anemia, and obesity, who reports snoring and excessive daytime somnolence, as well as difficulty with sleep maintenance and sleep initiation.  I reviewed your office note from 03/12/2015.  She has been on Seroquel XR 50 mg, 2 pill each night currently, down from 150 mg each night. She started taking it consistently around 2/15, and at a lesser dose since 2/16. She has a longstanding history of difficulty sleep, particularly staying asleep and loud snoring. She has tried in the past over-the-counter medications such as melatonin and p.m. type medications, she has also tried multiple prescription sleeping pills such as Ambien, Ambien CR, Lunesta, trazodone, none of which helped. She also was on clonazepam and trazodone at one time. She was admitted to behavioral health in November 2014 because of flareup of depression and severe insomnia. As I understand, Seroquel was started at the time.  She lives with her 2 teenage children, ages 14 and 6. She works full time as a Geophysicist/field seismologist. She does not smoke and rarely drinks alcohol and limits herself to once caffeine drink a day.   she does not have a TV in her bedroom. Her bedtime is around 8 or 9 PM. She takes her Seroquel at 6 PM typically. She knows not to drive after taking it. She has a rise time of 6 AM and she wakes up tired and not well rested. Her Epworth sleepiness score is 1 out of 24 and her  fatigue score is 54 out of 63. She has nocturia usually once per night but when she was not on Seroquel she would get up 405 times per night. She denies any clear restless legs symptoms and is not sure if she twitches her legs in her sleep. She has typically no morning headaches. She's not aware of any family history of OSA or insomnia or RLS.   Her Past Medical History Is Significant For: Past Medical History  Diagnosis Date  . Anemia   . Anxiety   . History of uterine fibroid   . History of keloid of skin   . H/O menorrhagia   . Depression   . Fibromyalgia     Her Past Surgical History Is Significant For: Past Surgical History  Procedure Laterality Date  . Cesarean section    . Laparoscopic vaginal hysterectomy  2009  . Tubal ligation Bilateral 2001  . Hysteroscopy  2008    with resection of fibroid     Her Family History Is Significant For: Family History  Problem Relation Age of Onset  . Hypertension Mother   . Heart disease Father   . Congestive Heart Failure Father   . Diabetes    . Cancer Maternal Grandmother     Her Social History Is Significant For: Social History   Social History  . Marital Status: Divorced    Spouse Name: N/A  . Number of Children: 2  . Years of Education: College   Occupational History  . Staffing  Social History Main Topics  . Smoking status: Never Smoker   . Smokeless tobacco: Never Used  . Alcohol Use: No  . Drug Use: No  . Sexual Activity: Not Asked   Other Topics Concern  . None   Social History Narrative   Drinks Energy drinks     Her Allergies Are:  No Known Allergies:   Her Current Medications Are:  Outpatient Encounter Prescriptions as of 04/21/2015  Medication Sig  . QUEtiapine (SEROQUEL XR) 50 MG TB24 24 hr tablet TAKE 2 TABLETS BY MOUTH EVERY NIGHT AT BEDTIME.  . [DISCONTINUED] naproxen (NAPROSYN) 375 MG tablet Take 1 tablet (375 mg total) by mouth 3 (three) times daily with meals. (Patient not taking:  Reported on 03/12/2015)   No facility-administered encounter medications on file as of 04/21/2015.  :  Review of Systems:  Out of a complete 14 point review of systems, all are reviewed and negative with the exception of these symptoms as listed below:   Review of Systems  Constitutional:       Weight gain   Neurological:       Trouble falling and staying asleep, snoring, wakes up feeling tired, daytime tiredness  Epworth Sleepiness Scale 0= would never doze 1= slight chance of dozing 2= moderate chance of dozing 3= high chance of dozing  Sitting and reading:0 Watching TV:1 Sitting inactive in a public place (ex. Theater or meeting):0 As a passenger in a car for an hour without a break:0 Lying down to rest in the afternoon:0 Sitting and talking to someone:0 Sitting quietly after lunch (no alcohol):0 In a car, while stopped in traffic:0 Total:1  Objective:  Neurologic Exam  Physical Exam Physical Examination:   Filed Vitals:   04/21/15 0844  BP: 118/80  Pulse: 76  Resp: 16   General Examination: The patient is a very pleasant 34 y.o. female in no acute distress. She appears well-developed and well-nourished and well groomed.   HEENT: Normocephalic, atraumatic, pupils are equal, round and reactive to light and accommodation. Funduscopic exam is normal with sharp disc margins noted. Extraocular tracking is good without limitation to gaze excursion or nystagmus noted. Normal smooth pursuit is noted. Hearing is grossly intact. Tympanic membranes are clear bilaterally. Face is symmetric with normal facial animation and normal facial sensation. Speech is clear with no dysarthria noted. There is no hypophonia. There is no lip, neck/head, jaw or voice tremor. Neck is supple with full range of passive and active motion. There are no carotid bruits on auscultation. Oropharynx exam reveals: mild mouth dryness, good dental hygiene and moderate airway crowding, due to larger longue, and  larger uvula and tonsils in place. Mallampati is class II. Tongue protrudes centrally and palate elevates symmetrically. Tonsils are 1-2+ in size. Neck size is 15 3/8 inches. She has a Mild overbite. Nasal inspection reveals no significant nasal mucosal bogginess or redness and no septal deviation.   Chest: Clear to auscultation without wheezing, rhonchi or crackles noted.  Heart: S1+S2+0, regular and normal without murmurs, rubs or gallops noted.   Abdomen: Soft, non-tender and non-distended with normal bowel sounds appreciated on auscultation.  Extremities: There is no pitting edema in the distal lower extremities bilaterally. Pedal pulses are intact.  Skin: Warm and dry without trophic changes noted. There are no varicose veins.  Musculoskeletal: exam reveals no obvious joint deformities, tenderness or joint swelling or erythema.   Neurologically:  Mental status: The patient is awake, alert and oriented in all 4 spheres. Her immediate  and remote memory, attention, language skills and fund of knowledge are appropriate. There is no evidence of aphasia, agnosia, apraxia or anomia. Speech is clear with normal prosody and enunciation. Thought process is linear. Mood is normal and affect is blunted.  Cranial nerves II - XII are as described above under HEENT exam. In addition: shoulder shrug is normal with equal shoulder height noted. Motor exam: Normal bulk, strength and tone is noted. There is no drift, tremor or rebound. Romberg is negative. Reflexes are 2+ throughout. Babinski: Toes are flexor bilaterally. Fine motor skills and coordination: intact with normal finger taps, normal hand movements, normal rapid alternating patting, normal foot taps and normal foot agility.  Cerebellar testing: No dysmetria or intention tremor on finger to nose testing. Heel to shin is unremarkable bilaterally. There is no truncal or gait ataxia.  Sensory exam: intact to light touch, pinprick, vibration, temperature  sense in the upper and lower extremities.  Gait, station and balance: She stands easily. No veering to one side is noted. No leaning to one side is noted. Posture is age-appropriate and stance is narrow based. Gait shows normal stride length and normal pace. No problems turning are noted. She turns en bloc. Tandem walk is unremarkable.   Assessment and Plan:   In summary, Mary Joyce is a very pleasant 34 y.o.-year old female with an underlying medical history of depression, anxiety, anemia, and obesity, whose history and physical exam are indeed concerning for obstructive sleep apnea (OSA). She has been on Seroquel XR for about 2 years. She has tried multiple other OTC and prescription sleep aids. 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  I had a long chat with the patient about my findings and the diagnosis of OSA, its prognosis and treatment options. We talked about medical treatments, surgical interventions and non-pharmacological approaches. I explained in particular the risks and ramifications of untreated moderate to severe OSA, especially with respect to developing cardiovascular disease down the Road, including congestive heart failure, difficult to treat hypertension, cardiac arrhythmias, or stroke. Even type 2 diabetes has, in part, been linked to untreated OSA. Symptoms of untreated OSA include daytime sleepiness, memory problems, mood irritability and mood disorder such as depression and anxiety, lack of energy, as well as recurrent headaches, especially morning headaches. We talked about trying to maintain a healthy lifestyle in general, as well as the importance of weight control. I encouraged the patient to eat healthy, exercise daily and keep well hydrated, to keep a scheduled bedtime and wake time routine, to not skip any meals and eat healthy snacks in between meals. I advised the patient  not to drive when feeling sleepy. I recommended the following at this time: sleep study with potential positive airway pressure titration. (We will score hypopneas at 3% and split the sleep study into diagnostic and treatment portion, if the estimated. 2 hour AHI is >15/h).   I explained the sleep test procedure to the patient and also outlined possible surgical and non-surgical treatment options of OSA, including the use of a custom-made dental device (which would require a referral to a specialist dentist or oral surgeon), upper airway surgical options, such as pillar implants, radiofrequency surgery, tongue base surgery, and UPPP (which would involve a referral to an ENT surgeon). Rarely, jaw surgery such as mandibular advancement may be considered.  I also explained the CPAP treatment option to the patient, who indicated that  she would be willing to try CPAP if the need arises. I explained the importance of being compliant with PAP treatment, not only for insurance purposes but primarily to improve Her symptoms, and for the patient's long term health benefit, including to reduce Her cardiovascular risks. I answered all her questions today and the patient was in agreement. I would like to see her back after the sleep study is completed and encouraged her to call with any interim questions, concerns, problems or updates.   Thank you very much for allowing me to participate in the care of this nice patient. If I can be of any further assistance to you please do not hesitate to call me at [phone].  Sincerely,   Huston Foley, MD, PhD

## 2015-04-21 NOTE — Patient Instructions (Signed)

## 2015-05-07 ENCOUNTER — Telehealth: Payer: Self-pay | Admitting: Neurology

## 2015-05-07 DIAGNOSIS — R351 Nocturia: Secondary | ICD-10-CM

## 2015-05-07 DIAGNOSIS — G4719 Other hypersomnia: Secondary | ICD-10-CM

## 2015-05-07 DIAGNOSIS — R0683 Snoring: Secondary | ICD-10-CM

## 2015-05-07 NOTE — Telephone Encounter (Signed)
[employer] denied Split sleep study.  I have a HST approved.  Can I get an order for HST?

## 2015-05-07 NOTE — Telephone Encounter (Signed)
Order is in.

## 2015-05-19 ENCOUNTER — Encounter (INDEPENDENT_AMBULATORY_CARE_PROVIDER_SITE_OTHER): Payer: BLUE CROSS/BLUE SHIELD | Admitting: Neurology

## 2015-05-19 DIAGNOSIS — G4710 Hypersomnia, unspecified: Secondary | ICD-10-CM | POA: Diagnosis not present

## 2015-05-19 DIAGNOSIS — R0683 Snoring: Secondary | ICD-10-CM

## 2015-05-19 DIAGNOSIS — G4719 Other hypersomnia: Secondary | ICD-10-CM

## 2015-05-19 DIAGNOSIS — R351 Nocturia: Secondary | ICD-10-CM

## 2015-05-21 ENCOUNTER — Telehealth: Payer: Self-pay | Admitting: Neurology

## 2015-05-21 NOTE — Telephone Encounter (Signed)
I spoke to patient and she is aware of results and recommendations. She will f/u with PCP. I have sent a copy of the report to PCP. She was advised to call us back if she changes her mind and would like to discuss with Dr. Frances FurbishAthar.

## 2015-05-21 NOTE — Telephone Encounter (Signed)
Patient referred by Dr. Patsy Lageropland, seen by me on 04/21/15, HST on 05/19/15.   Please call and notify the patient that the recent home sleep test did not show any significant obstructive sleep apnea. Patient can follow up with the referring provider. I can see patient in FU if desired.  A copy of the report will be sent to the patient, the PCP and referring MD, if other than PCP.  Once you have spoken to patient, you can close this encounter.   Thanks,  Huston FoleySaima Nicolis Boody, MD, PhD [facility]

## 2015-06-26 ENCOUNTER — Encounter: Payer: Self-pay | Admitting: Family Medicine

## 2015-07-01 ENCOUNTER — Encounter: Payer: Self-pay | Admitting: Family Medicine

## 2015-09-24 ENCOUNTER — Ambulatory Visit (INDEPENDENT_AMBULATORY_CARE_PROVIDER_SITE_OTHER): Payer: BLUE CROSS/BLUE SHIELD | Admitting: Family Medicine

## 2015-09-24 ENCOUNTER — Encounter: Payer: Self-pay | Admitting: Family Medicine

## 2015-09-24 ENCOUNTER — Other Ambulatory Visit (HOSPITAL_COMMUNITY)
Admission: RE | Admit: 2015-09-24 | Discharge: 2015-09-24 | Disposition: A | Discharge status: Home / Self Care | Payer: BLUE CROSS/BLUE SHIELD | Source: Ambulatory Visit | Attending: Family Medicine | Admitting: Family Medicine

## 2015-09-24 VITALS — BP 134/98 | HR 75 | Temp 98.3°F | Ht 66.0 in | Wt 198.2 lb

## 2015-09-24 DIAGNOSIS — Z13220 Encounter for screening for lipoid disorders: Secondary | ICD-10-CM | POA: Diagnosis not present

## 2015-09-24 DIAGNOSIS — Z0000 Encounter for general adult medical examination without abnormal findings: Secondary | ICD-10-CM

## 2015-09-24 DIAGNOSIS — N760 Acute vaginitis: Secondary | ICD-10-CM | POA: Diagnosis present

## 2015-09-24 DIAGNOSIS — Z131 Encounter for screening for diabetes mellitus: Secondary | ICD-10-CM

## 2015-09-24 DIAGNOSIS — Z130 Encounter for screening for diseases of the blood and blood-forming organs and certain disorders involving the immune mechanism: Secondary | ICD-10-CM

## 2015-09-24 DIAGNOSIS — G4700 Insomnia, unspecified: Secondary | ICD-10-CM

## 2015-09-24 DIAGNOSIS — Z113 Encounter for screening for infections with a predominantly sexual mode of transmission: Secondary | ICD-10-CM | POA: Diagnosis present

## 2015-09-24 NOTE — Patient Instructions (Signed)
Great job with your exercise and weight loss- you are to be commended!   I will be in touch with your labs asap Randie HeinzGreat to see you today!

## 2015-09-24 NOTE — Progress Notes (Signed)
[facility] [address], [address] [place], [place] [address] [phone] [phone]  Date:  09/24/2015   Name:  Mary Joyce   DOB:  31-Jan-1981   MRN:  [id number]  PCP:  Abbe AmsterdamOPLAND,Solyana Nonaka, MD    Chief Complaint: Annual Exam   History of Present Illness:  Mary Joyce is a 35 y.o. very pleasant female patient who presents with the following:  She would like to have a CPE today- she would like to do labs and STI screening.   She has noted some trouble wiht her right wrist for a month or so.  NKI, but it has ached some when she does push ups or types a lot.   She has been exercising and has lost some weight- she is really pleased We had reduced her dose of seroquel - she is now down to 50 mg. This is controlling her sx of chronic insomnia- she is sleeping enough.  If she does not sleep she will take extra the next night.   Her mood is overall good, she is in good spirits  hyst done 5 years ago- benign.  No cancers.  Her job is going ok Her son is graduating from high school this summer- he wants to go to [employer] and then maybe transfer to a 4 year college.  Her daughter is 37 yo and also doing well  She is exercising 5x a week at the gym with a trainer  she is really trying to improve her health  Lab Results  Component Value Date   HGBA1C 5.9* 03/12/2015   She did have pre-diabetes at her last labs and is eager to see any change.    Wt Readings from Last 3 Encounters:  09/24/15 198 lb 3.2 oz (89.903 kg)  04/21/15 203 lb (92.08 kg)  03/12/15 207 lb (93.895 kg)     Patient Active Problem List   Diagnosis Date Noted  . Obesity 07/31/2014  . MDD (major depressive disorder) (HCC) 05/02/2013  . Anxiety and depression 02/20/2013    Past Medical History  Diagnosis Date  . Anemia   . Anxiety   . History of uterine fibroid   . History of keloid of skin   . H/O menorrhagia   . Depression   . Fibromyalgia     Past Surgical History   Procedure Laterality Date  . Cesarean section    . Laparoscopic vaginal hysterectomy  2009  . Tubal ligation Bilateral 2001  . Hysteroscopy  2008    with resection of fibroid     Social History  Substance Use Topics  . Smoking status: Never Smoker   . Smokeless tobacco: Never Used  . Alcohol Use: No    Family History  Problem Relation Age of Onset  . Hypertension Mother   . Heart disease Father   . Congestive Heart Failure Father   . Diabetes    . Cancer Maternal Grandmother     No Known Allergies  Medication list has been reviewed and updated.  Current Outpatient Prescriptions on File Prior to Visit  Medication Sig Dispense Refill  . QUEtiapine (SEROQUEL XR) 50 MG TB24 24 hr tablet TAKE 2 TABLETS BY MOUTH EVERY NIGHT AT BEDTIME. 180 tablet 3   No current facility-administered medications on file prior to visit.    Review of Systems:  As per HPI- otherwise negative.   Physical Examination: Filed Vitals:   09/24/15 1443  BP: 134/98  Pulse: 75  Temp: 98.3 F (36.8 C)   Filed Vitals:   09/24/15 1443  Height:  (1.676 m)  Weight: 198 lb 3.2 oz (89.903 kg)   Body mass index is 32.01 kg/(m^2). Ideal Body Weight: Weight in (lb) to have BMI = 25: 154.6  GEN: WDWN, NAD, Non-toxic, A & O x 3, obese, looks well HEENT: Atraumatic, Normocephalic. Neck supple. No masses, No LAD.  Bilateral TM wnl, oropharynx normal.  PEERL,EOMI.   Ears and Nose: No external deformity. CV: RRR, No M/G/R. No JVD. No thrill. No extra heart sounds. PULM: CTA B, no wheezes, crackles, rhonchi. No retractions. No resp. distress. No accessory muscle use. ABD: S, NT, ND, +BS. No rebound. No HSM. EXTR: No c/c/e NEURO Normal gait.  PSYCH: Normally interactive. Conversant. Not depressed or anxious appearing.  Calm demeanor.  Breast: normal exam, no masses/ dimpling/ discharge Right wrist: no swelling, redness or lesion.  Mild tenderness with ROM of the wrist- over the mid wrist.  Full  strength of the wrist.     Assessment and Plan: Physical exam  Routine screening for STI (sexually transmitted infection) - Plan: Hepatitis B surface antibody, Hepatitis B surface antigen, Hepatitis C antibody, RPR, HIV antibody, Urine cytology ancillary only  Screening for hyperlipidemia - Plan: Lipid panel  Screening for diabetes mellitus - Plan: Comprehensive metabolic panel, Hemoglobin A1c  Screening for deficiency anemia - Plan: CBC  Insomnia  Physical exam, labs Will be in touch with her pending labs We hope that her A1c will be back to normal STI screening Continue seroquel for her insomnia Last pap was last year, negative HPV.   Try an OTC cock-up wrist splint for 1-2 weeks.  Wear as much as is practical. If not better let me know and we can do films     Signed Abbe Amsterdam, MD

## 2015-09-24 NOTE — Progress Notes (Signed)
Pre visit review using our clinic tool,if applicable. No additional management support is needed unless otherwise documented below in the visit note.  

## 2015-09-25 LAB — CBC
HCT: 36.5 % (ref 36.0–46.0)
Hemoglobin: 12.1 g/dL (ref 12.0–15.0)
MCHC: 33.2 g/dL (ref 30.0–36.0)
MCV: 74.3 fl — ABNORMAL LOW (ref 78.0–100.0)
PLATELETS: 384 10*3/uL (ref 150.0–400.0)
RBC: 4.91 Mil/uL (ref 3.87–5.11)
RDW: 15.6 % — ABNORMAL HIGH (ref 11.5–15.5)
WBC: 7.7 10*3/uL (ref 4.0–10.5)

## 2015-09-25 LAB — LIPID PANEL
CHOL/HDL RATIO: 5
Cholesterol: 184 mg/dL (ref 0–200)
HDL: 39.1 mg/dL (ref >39.00)
LDL Cholesterol: 110 mg/dL — ABNORMAL HIGH (ref 0–99)
NONHDL: 144.76
TRIGLYCERIDES: 173 mg/dL — AB (ref 0.0–149.0)
VLDL: 34.6 mg/dL (ref 0.0–40.0)

## 2015-09-25 LAB — COMPREHENSIVE METABOLIC PANEL
ALT: 14 U/L (ref 0–35)
AST: 13 U/L (ref 0–37)
Albumin: 4.5 g/dL (ref 3.5–5.2)
Alkaline Phosphatase: 61 U/L (ref 39–117)
BILIRUBIN TOTAL: 0.4 mg/dL (ref 0.2–1.2)
BUN: 11 mg/dL (ref 6–23)
CALCIUM: 9.8 mg/dL (ref 8.4–10.5)
CO2: 27 meq/L (ref 19–32)
CREATININE: 0.66 mg/dL (ref 0.40–1.20)
Chloride: 103 mEq/L (ref 96–112)
GFR: 129.43 mL/min (ref >60.00)
GLUCOSE: 93 mg/dL (ref 70–99)
Potassium: 3.9 mEq/L (ref 3.5–5.1)
Sodium: 138 mEq/L (ref 135–145)
Total Protein: 8 g/dL (ref 6.0–8.3)

## 2015-09-25 LAB — RPR

## 2015-09-25 LAB — HEMOGLOBIN A1C: HEMOGLOBIN A1C: 5.5 % (ref 4.6–6.5)

## 2015-09-25 LAB — HEPATITIS B SURFACE ANTIBODY, QUANTITATIVE: HEPATITIS B-POST: 0 m[IU]/mL

## 2015-09-25 LAB — HEPATITIS B SURFACE ANTIGEN: Hepatitis B Surface Ag: NEGATIVE

## 2015-09-25 LAB — HIV ANTIBODY (ROUTINE TESTING W REFLEX): HIV: NONREACTIVE

## 2015-09-25 LAB — HEPATITIS C ANTIBODY: HCV Ab: NEGATIVE

## 2015-09-28 LAB — URINE CYTOLOGY ANCILLARY ONLY
Chlamydia: NEGATIVE
Neisseria Gonorrhea: NEGATIVE
Trichomonas: NEGATIVE

## 2016-03-25 ENCOUNTER — Other Ambulatory Visit: Payer: Self-pay | Admitting: Emergency Medicine

## 2016-03-25 ENCOUNTER — Encounter: Payer: Self-pay | Admitting: Family Medicine

## 2016-03-25 ENCOUNTER — Telehealth: Payer: Self-pay | Admitting: Family Medicine

## 2016-03-25 DIAGNOSIS — F321 Major depressive disorder, single episode, moderate: Secondary | ICD-10-CM

## 2016-03-25 MED ORDER — QUETIAPINE FUMARATE ER 50 MG PO TB24: ORAL_TABLET | ORAL | 3 refills | Status: DC

## 2016-03-25 NOTE — Telephone Encounter (Signed)
Pt called in requesting a call back from provider. Asked pt if I could take a detailed message for provider. She says that she would just like a call back at : [phone]

## 2016-03-25 NOTE — Telephone Encounter (Signed)
Spoke to pt who states that she will need a letter stating that she takes Seroquel for insomnia and not for depression. Pt states that she had depression in the past but was later dx with Insomnia which was causing the depression sx's. Pt needs this letter for insurance purposes.   Pt states that insurance denied her and told her it's because Seroquel is for depression. Pt will also need refill on Seroquel.   Pt would like to know if letter can be uploaded to MyChart. If not pt would like letter to be left at the front office so that she can pick it up today if possible.

## 2016-04-18 ENCOUNTER — Other Ambulatory Visit: Payer: Self-pay | Admitting: Family Medicine

## 2016-04-18 DIAGNOSIS — F321 Major depressive disorder, single episode, moderate: Secondary | ICD-10-CM

## 2016-05-12 ENCOUNTER — Emergency Department (HOSPITAL_COMMUNITY)
Admission: EM | Admit: 2016-05-12 | Discharge: 2016-05-13 | Disposition: A | Discharge status: Home / Self Care | Payer: BLUE CROSS/BLUE SHIELD | Attending: Emergency Medicine | Admitting: Emergency Medicine

## 2016-05-12 ENCOUNTER — Emergency Department (HOSPITAL_COMMUNITY): Payer: BLUE CROSS/BLUE SHIELD

## 2016-05-12 ENCOUNTER — Encounter (HOSPITAL_COMMUNITY): Payer: Self-pay | Admitting: Emergency Medicine

## 2016-05-12 DIAGNOSIS — J705 Respiratory conditions due to smoke inhalation: Secondary | ICD-10-CM | POA: Insufficient documentation

## 2016-05-12 DIAGNOSIS — T59811A Toxic effect of smoke, accidental (unintentional), initial encounter: Secondary | ICD-10-CM

## 2016-05-12 DIAGNOSIS — R0789 Other chest pain: Secondary | ICD-10-CM | POA: Diagnosis present

## 2016-05-12 LAB — URINALYSIS, ROUTINE W REFLEX MICROSCOPIC
BILIRUBIN URINE: NEGATIVE
Glucose, UA: NEGATIVE mg/dL
Hgb urine dipstick: NEGATIVE
KETONES UR: NEGATIVE mg/dL
Nitrite: NEGATIVE
Protein, ur: NEGATIVE mg/dL
Specific Gravity, Urine: 1.009 (ref 1.005–1.030)
pH: 7 (ref 5.0–8.0)

## 2016-05-12 LAB — BASIC METABOLIC PANEL
ANION GAP: 8 (ref 5–15)
BUN: 10 mg/dL (ref 6–20)
CALCIUM: 9.5 mg/dL (ref 8.9–10.3)
CO2: 23 mmol/L (ref 22–32)
Chloride: 107 mmol/L (ref 101–111)
Creatinine, Ser: 0.52 mg/dL (ref 0.44–1.00)
GFR calc Af Amer: >60 mL/min (ref >60)
GFR calc non Af Amer: >60 mL/min (ref >60)
GLUCOSE: 128 mg/dL — AB (ref 65–99)
POTASSIUM: 3.5 mmol/L (ref 3.5–5.1)
Sodium: 138 mmol/L (ref 135–145)

## 2016-05-12 LAB — CBC
HEMATOCRIT: 36.5 % (ref 36.0–46.0)
Hemoglobin: 12.4 g/dL (ref 12.0–15.0)
MCH: 25.1 pg — AB (ref 26.0–34.0)
MCHC: 34 g/dL (ref 30.0–36.0)
MCV: 73.9 fL — AB (ref 78.0–100.0)
Platelets: 362 10*3/uL (ref 150–400)
RBC: 4.94 MIL/uL (ref 3.87–5.11)
RDW: 14.4 % (ref 11.5–15.5)
WBC: 8.1 10*3/uL (ref 4.0–10.5)

## 2016-05-12 LAB — I-STAT TROPONIN, ED: Troponin i, poc: 0.01 ng/mL (ref 0.00–0.08)

## 2016-05-12 MED ORDER — IPRATROPIUM-ALBUTEROL 0.5-2.5 (3) MG/3ML IN SOLN
3.0000 mL | Freq: Once | RESPIRATORY_TRACT | Status: AC
  Administered 2016-05-12: 3 mL via RESPIRATORY_TRACT
  Filled 2016-05-12: qty 3

## 2016-05-12 MED ORDER — DEXAMETHASONE 4 MG PO TABS
12.0000 mg | ORAL_TABLET | Freq: Once | ORAL | Status: AC
  Administered 2016-05-13: 12 mg via ORAL
  Filled 2016-05-12: qty 3
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
## 2016-05-13 NOTE — ED Notes (Signed)
Patient d/c'd self care.  F/U reviewed.  Patient verbalized understanding. 

## 2016-05-18 ENCOUNTER — Telehealth: Payer: Self-pay | Admitting: Family Medicine

## 2016-05-18 DIAGNOSIS — T59811A Toxic effect of smoke, accidental (unintentional), initial encounter: Secondary | ICD-10-CM

## 2016-05-18 DIAGNOSIS — J705 Respiratory conditions due to smoke inhalation: Principal | ICD-10-CM

## 2016-05-18 MED ORDER — ALBUTEROL SULFATE HFA 108 (90 BASE) MCG/ACT IN AERS: 2.0000 | INHALATION_SPRAY | Freq: Four times a day (QID) | RESPIRATORY_TRACT | 0 refills | Status: DC | PRN
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
## 2016-05-19 ENCOUNTER — Telehealth: Payer: Self-pay | Admitting: Family Medicine
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
## 2016-06-30 ENCOUNTER — Encounter: Payer: Self-pay | Admitting: Family Medicine

## 2016-07-25 ENCOUNTER — Encounter: Payer: Self-pay | Admitting: Family Medicine

## 2016-07-25 ENCOUNTER — Ambulatory Visit (HOSPITAL_BASED_OUTPATIENT_CLINIC_OR_DEPARTMENT_OTHER)
Admission: RE | Admit: 2016-07-25 | Discharge: 2016-07-25 | Disposition: A | Discharge status: Home / Self Care | Payer: BLUE CROSS/BLUE SHIELD | Source: Ambulatory Visit | Attending: Family Medicine | Admitting: Family Medicine

## 2016-07-25 ENCOUNTER — Ambulatory Visit (INDEPENDENT_AMBULATORY_CARE_PROVIDER_SITE_OTHER): Payer: BLUE CROSS/BLUE SHIELD | Admitting: Family Medicine

## 2016-07-25 VITALS — BP 130/88 | HR 88 | Temp 98.5°F | Ht 65.0 in | Wt 205.6 lb

## 2016-07-25 DIAGNOSIS — R29898 Other symptoms and signs involving the musculoskeletal system: Secondary | ICD-10-CM | POA: Diagnosis not present

## 2016-07-25 DIAGNOSIS — M25531 Pain in right wrist: Secondary | ICD-10-CM
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
## 2016-07-25 NOTE — Patient Instructions (Signed)
It was very good to see you today Continue to use your wrist splint as needed- it may be helpful to wear it during the day also We will get x-rays of your wrist today I am also going to refer you to a hand specialist for your wrist

## 2016-07-25 NOTE — Progress Notes (Signed)
Pre visit review using our clinic review tool, if applicable. No additional management support is needed unless otherwise documented below in the visit note. 

## 2016-08-02 ENCOUNTER — Ambulatory Visit (INDEPENDENT_AMBULATORY_CARE_PROVIDER_SITE_OTHER): Payer: BLUE CROSS/BLUE SHIELD | Admitting: Orthopaedic Surgery

## 2016-08-22 ENCOUNTER — Ambulatory Visit (INDEPENDENT_AMBULATORY_CARE_PROVIDER_SITE_OTHER): Payer: BLUE CROSS/BLUE SHIELD | Admitting: Orthopaedic Surgery

## 2016-08-24 ENCOUNTER — Other Ambulatory Visit: Payer: Self-pay | Admitting: Emergency Medicine

## 2016-08-24 ENCOUNTER — Other Ambulatory Visit: Payer: Self-pay

## 2016-08-24 ENCOUNTER — Encounter: Payer: Self-pay | Admitting: Family Medicine

## 2016-08-24 ENCOUNTER — Telehealth: Payer: Self-pay

## 2016-08-24 DIAGNOSIS — F321 Major depressive disorder, single episode, moderate: Secondary | ICD-10-CM

## 2016-08-24 MED ORDER — QUETIAPINE FUMARATE 25 MG PO TABS: 25.0000 mg | ORAL_TABLET | Freq: Every day | ORAL | 0 refills | Status: DC

## 2016-08-24 MED ORDER — QUETIAPINE FUMARATE ER 50 MG PO TB24: ORAL_TABLET | ORAL | 2 refills | Status: DC

## 2016-08-24 NOTE — Telephone Encounter (Addendum)
Per pharmacist Seroquel can not be halfed. Per Dr. Patsy Lageropland change Rx to Seroquel 25 mg q hs. Verified with pharmacist.

## 2016-08-24 NOTE — Telephone Encounter (Signed)
[facility] "Drug not covered by patient plan. The preferred alternative is QUETIAPINE FUMARATE ER. If you would like to change this medication the pharmacy is requesting strength, directions, quanity, and refills. Please advise.

## 2016-10-20 ENCOUNTER — Encounter (HOSPITAL_COMMUNITY): Payer: Self-pay | Admitting: Emergency Medicine

## 2016-10-20 ENCOUNTER — Emergency Department (HOSPITAL_COMMUNITY)
Admission: EM | Admit: 2016-10-20 | Discharge: 2016-10-20 | Disposition: A | Discharge status: Home / Self Care | Payer: 59 | Attending: Emergency Medicine | Admitting: Emergency Medicine

## 2016-10-20 ENCOUNTER — Emergency Department (HOSPITAL_COMMUNITY): Payer: 59

## 2016-10-20 DIAGNOSIS — F419 Anxiety disorder, unspecified: Secondary | ICD-10-CM | POA: Insufficient documentation

## 2016-10-20 DIAGNOSIS — R0602 Shortness of breath: Secondary | ICD-10-CM | POA: Diagnosis not present

## 2016-10-20 DIAGNOSIS — R0989 Other specified symptoms and signs involving the circulatory and respiratory systems: Secondary | ICD-10-CM | POA: Diagnosis present

## 2016-10-20 DIAGNOSIS — F458 Other somatoform disorders: Secondary | ICD-10-CM | POA: Diagnosis not present

## 2016-10-20 DIAGNOSIS — K209 Esophagitis, unspecified without bleeding: Secondary | ICD-10-CM

## 2016-10-20 DIAGNOSIS — Z79899 Other long term (current) drug therapy: Secondary | ICD-10-CM | POA: Diagnosis not present

## 2016-10-20 LAB — RAPID STREP SCREEN (MED CTR MEBANE ONLY): STREPTOCOCCUS, GROUP A SCREEN (DIRECT): NEGATIVE

## 2016-10-20 MED ORDER — GI COCKTAIL ~~LOC~~
30.0000 mL | Freq: Once | ORAL | Status: AC
  Administered 2016-10-20: 30 mL via ORAL
  Filled 2016-10-20: qty 30

## 2016-10-20 MED ORDER — SUCRALFATE 1 GM/10ML PO SUSP: 1.0000 g | Freq: Three times a day (TID) | ORAL | 0 refills | Status: DC

## 2016-10-20 NOTE — ED Notes (Addendum)
Went in pt room with provider to explain discharge and follow up instructions. Pt became upset once again claiming facility is not taking her medical complaint seriously. Both parties explained to the pt that her concerns are understood. However, her tests have come back and show no reason for her to remain in our care at this time. Pt has had emesis bag laying either in the bed next to her or on the chair next to her bed during her entire stay without it being used. Pt has had no issues with swallowing or controlling her secretions during her stay. Both parties explained to pt that it is understood that she may feel as if something is still in her throat but, upon provider observation prior to discharge her throat shows no signs of swelling and/or blockage. Pt insisted on seeing another provider or going to another hospital. Explained to pt she has the right to be seen at another facility but also encouraged her to follow discharge instructions and see GI specialist for further care.

## 2016-10-20 NOTE — ED Provider Notes (Signed)
[facility]-EMERGENCY DEPT Provider Note   CSN: [id number] Arrival date & time: 10/20/16  0133  By signing my name below, I, Mary Joyce, attest that this documentation has been prepared under the direction and in the presence of Jayr Lupercio, MD. Electronically Signed: Elder Joyce, Scribe. 10/20/16. 2:01 AM.   History   Chief Complaint Chief Complaint  Patient presents with  . Shortness of Breath    HPI Mary Joyce is a 36 y.o. female with history of anemia who presents to the ED with sensation of foreign body in throat. This patient states that she woke from sleep around 2 hours ago with sensation that an object was obstructing her throat; he is short of breath. "When I cough its like I cannot breath." Her last food intake was 7 hours ago. She denies any new medications or allergens. No rashes.  The history is provided by the patient. No language interpreter was used.  Shortness of Breath  This is a new problem. The current episode started 1 to 2 hours ago. Pertinent negatives include no fever, no sore throat, no ear pain, no cough, no chest pain, no vomiting, no abdominal pain and no rash. Associated symptoms comments: Sensation of foreign body in throat. It is unknown what precipitated the problem. She has tried nothing for the symptoms.    Past Medical History:  Diagnosis Date  . Anemia   . Anxiety   . Depression   . Fibromyalgia   . H/O menorrhagia   . History of keloid of skin   . History of uterine fibroid     Patient Active Problem List   Diagnosis Date Noted  . Obesity 07/31/2014  . MDD (major depressive disorder) 05/02/2013  . Anxiety and depression 02/20/2013    Past Surgical History:  Procedure Laterality Date  . ABDOMINAL HYSTERECTOMY    . CESAREAN SECTION    . HYSTEROSCOPY  2008   with resection of fibroid   . LAPAROSCOPIC VAGINAL HYSTERECTOMY  2009  . TUBAL LIGATION Bilateral 2001    OB History    No data available       Home  Medications    Prior to Admission medications   Medication Sig Start Date End Date Taking? Authorizing Provider  albuterol (PROVENTIL HFA;VENTOLIN HFA) 108 (90 Base) MCG/ACT inhaler Inhale 2 puffs into the lungs every 6 (six) hours as needed for wheezing or shortness of breath. 05/18/16   Copland, Gwenlyn Found, MD  QUEtiapine (SEROQUEL) 25 MG tablet Take 1 tablet (25 mg total) by mouth at bedtime. 08/24/16   Copland, Gwenlyn Found, MD    Family History Family History  Problem Relation Age of Onset  . Hypertension Mother   . Heart disease Father   . Congestive Heart Failure Father   . Cancer Maternal Grandmother   . Diabetes Unknown     Social History Social History  Substance Use Topics  . Smoking status: Never Smoker  . Smokeless tobacco: Never Used  . Alcohol use No     Allergies   Patient has no known allergies.   Review of Systems Review of Systems  Constitutional: Negative for chills and fever.  HENT: Negative for ear pain and sore throat.        Throat discomfort; difficulty respirating.  Eyes: Negative for pain and visual disturbance.  Respiratory: Negative for cough.   Cardiovascular: Negative for chest pain and palpitations.  Gastrointestinal: Negative for abdominal pain and vomiting.  Genitourinary: Negative for dysuria and hematuria.  Musculoskeletal: Negative for  arthralgias and back pain.  Skin: Negative for color change and rash.  Neurological: Negative for seizures and syncope.  All other systems reviewed and are negative.    Physical Exam Updated Vital Signs BP 136/88 (BP Location: Left Arm)   Pulse 79   Temp 98.2 F (36.8 C) (Oral)   Resp 18   Ht 5\' 5"  (1.651 m)   Wt 200 lb (90.7 kg)   SpO2 100%   BMI 33.28 kg/m   Physical Exam  Constitutional: She appears well-developed and well-nourished. No distress.  HENT:  Head: Normocephalic and atraumatic.  Mouth/Throat: Oropharynx is clear and moist. No oropharyngeal exudate.  mallempati class 1, no  swelling of the lips, tongue or uvula  Eyes: Conjunctivae are normal. Pupils are equal, round, and reactive to light.  Neck: Trachea normal, normal range of motion and phonation normal. Neck supple. No JVD present. No tracheal tenderness and no spinous process tenderness present. No tracheal deviation, no edema and normal range of motion present.  Patient speaking in complete sentences.  No hoarseness.  No spitting no vomiting in the room handling all secretions, there has been no coughing in the room  Cardiovascular: Normal rate, regular rhythm, normal heart sounds and intact distal pulses.   No murmur heard. Pulmonary/Chest: Effort normal and breath sounds normal. No stridor. No respiratory distress. She has no wheezes. She has no rales.  Abdominal: Soft. There is no tenderness.  Musculoskeletal: Normal range of motion. She exhibits no edema.  Lymphadenopathy:    She has no cervical adenopathy.  Neurological: She is alert. She displays normal reflexes.  Skin: Skin is warm and dry. Capillary refill takes less than 2 seconds. No rash noted.  Psychiatric: Her mood appears anxious.  Nursing note and vitals reviewed.    ED Treatments / Results   Vitals:   10/20/16 0141  BP: 136/88  Pulse: 79  Resp: 18  Temp: 98.2 F (36.8 C)    Labs (all labs ordered are listed, but only abnormal results are displayed) Results for orders placed or performed during the hospital encounter of 10/20/16  Rapid strep screen  Result Value Ref Range   Streptococcus, Group A Screen (Direct) NEGATIVE NEGATIVE   Dg Neck Soft Tissue  Result Date: 10/20/2016 CLINICAL DATA:  Painful swallowing EXAM: NECK SOFT TISSUES - 1+ VIEW COMPARISON:  None. FINDINGS: There is no evidence of retropharyngeal soft tissue swelling or epiglottic enlargement. The cervical airway is unremarkable and no radio-opaque foreign body identified. IMPRESSION: Negative. Electronically Signed   By: Deatra RobinsonKevin  Herman M.D.   On: 10/20/2016 03:04    Procedures Procedures (including critical care time)  Medications Ordered in ED  Medications  gi cocktail (Maalox,Lidocaine,Donnatal) (30 mLs Oral Given 10/20/16 0322)     Final Clinical Impressions(s) / ED Diagnoses  I suspect esophageal irritation from food or a pill: Patient is very upset that she feels something moving inside. I see no signs of infection on exam, imaging or strep test.  Moreover there is no esophageal dilation no fluid levels in the esophagus on XR and no inflammation or signs of infection.  Patient is speaking in complete sentences and she awoke with this and stated she did not have dinner this evening.  No coughing in the room, swallowing all her secretions, swallowed GI cocktail without difficulty, no emesis. I suspect there is a secondary anxiety component.  I do not feel emergent GI consultation is indicated and the patient was watched for a considerable time following administration of  medicine for any signs of emesis, no drooling.    I feel the patient can safely follow up with GI as an outpatient and does not require endoscopy in the ED.   The patient is nontoxic-appearing on exam and vital signs are within normal limits.   I have reviewed the triage vital signs and the nursing notes. Pertinent labs &imaging results that were available during my care of the patient were reviewed by me and considered in my medical decision making (see chart for details). The patient is nontoxic-appearing on exam and vital signs are within normal limits. Return for fevers, swelling of the face, tongue or lips, inability to swallow secretions, inability to make speech, exertion or any concerns. follow up with dermatology and your regular doctor take all antibiotics  After history, exam, and medical workup I feel the patient has been appropriately medically screened and is safe for discharge home. Pertinent diagnoses were discussed with the patient. Patient was given return  precautions.   I personally performed the services described in this documentation, which was scribed in my presence. The recorded information has been reviewed and is accurate.      Nairi Oswald, MD 10/20/16 [phone]

## 2016-10-20 NOTE — ED Triage Notes (Signed)
Pt states she woke up feeling like she was choking and feels like something is stuck in her throat  Pt states when she coughs it feels like it occludes her airway  Pt is very  Anxious in triage

## 2016-10-20 NOTE — ED Notes (Addendum)
Pt visibly upset claiming provider does not believe her complaints are real. Provider has expressed to pt that she does in fact take the pts concerns seriously. Tests show no signs for concern at this time. GI cocktail ordered and provided to assist pt medical concerns. Pt has no difficulty swallowing, is handling own secretions well since arrival to facility.

## 2016-10-20 NOTE — ED Provider Notes (Signed)
Patient was reexamined with nurse, Mardella LaymanLindsey, present.  She was asleep upon entrance to the room without coughing and her emesis bag is across the room.    Patient then became angry stating she has never had this before stating "we don't believe her."  EDP repeated that we believe here and that no one is saying we do not believe her. EDP stated that there is nothing obstructing her airway as she has had normal O2 saturation and has been sleeping calmly in the room and has intact phonation. She has not coughed or spit once since she has been in the ED.  The EDP reiterated there are no signs on exam or imaging of impaction of food, foreign body, which by her own account she did not have any meat or food bolus and the GI cocktail was swallowed and has not been vomited nor spit up.  She is once again instructed to take all medication as directed not eat meat and only soft foods for the next few days and follow up with GI.   There is no indication for immediately GI consultation but patient was provided a note for work so she can get some rest and call GI   Mary Perin, MD 10/20/16 [phone]

## 2016-10-21 ENCOUNTER — Encounter: Payer: Self-pay | Admitting: Nurse Practitioner

## 2016-10-21 ENCOUNTER — Encounter: Payer: Self-pay | Admitting: Internal Medicine

## 2016-10-21 ENCOUNTER — Ambulatory Visit (INDEPENDENT_AMBULATORY_CARE_PROVIDER_SITE_OTHER): Payer: 59 | Admitting: Nurse Practitioner

## 2016-10-21 VITALS — BP 108/60 | HR 62 | Ht 66.0 in | Wt 204.0 lb

## 2016-10-21 DIAGNOSIS — F458 Other somatoform disorders: Secondary | ICD-10-CM | POA: Diagnosis not present

## 2016-10-21 DIAGNOSIS — K219 Gastro-esophageal reflux disease without esophagitis: Secondary | ICD-10-CM | POA: Diagnosis not present

## 2016-10-21 DIAGNOSIS — R0989 Other specified symptoms and signs involving the circulatory and respiratory systems: Secondary | ICD-10-CM

## 2016-10-21 NOTE — Patient Instructions (Signed)
We have provided you with Prilosec 20 mg samples for 8 days.  Take 1 tablet 30 min before breakfast.   You have been scheduled for an endoscopy. Please follow written instructions given to you at your visit today. If you use inhalers (even only as needed), please bring them with you on the day of your procedure. Your physician has requested that you go to www.startemmi.com and enter the access code given to you at your visit today. This web site gives a general overview about your procedure. However, you should still follow specific instructions given to you by our office regarding your preparation for the procedure.

## 2016-10-21 NOTE — Progress Notes (Addendum)
HPI: Patient is a 36 year old female seen in the emergency department yesterday with a sensation of a foreign body in her throat. She woke up 2 mornings ago choking and short of breath. She went to bed feeling fine. She does not sleep with any bleaching trays or other items in her mouth at night. In ED a soft tissue x-ray of the neck was unremarkable. Patient was given a prescription for Carafate but has not yet filled it. It is uncomfortable to swallow, she feels like something is still in her throat and just shifting around. She is tolerating liquids and some solids. No recent antibiotics.  No hx of candida esophagitis. Joda has occasional GERD symptoms but heartburn has been slightly worse over last couple of weeks. She has no dysphagia.   Past Medical History:  Diagnosis Date  . Anemia   . Anxiety   . Depression   . Fibromyalgia   . H/O menorrhagia   . History of keloid of skin   . History of uterine fibroid     Past Surgical History:  Procedure Laterality Date  . ABDOMINAL HYSTERECTOMY    . CESAREAN SECTION    . HYSTEROSCOPY  2008   with resection of fibroid   . LAPAROSCOPIC VAGINAL HYSTERECTOMY  2009  . TUBAL LIGATION Bilateral 2001   Family History  Problem Relation Age of Onset  . Hypertension Mother   . Heart disease Father   . Congestive Heart Failure Father   . Cancer Maternal Grandmother   . Diabetes Unknown    Social History  Substance Use Topics  . Smoking status: Never Smoker  . Smokeless tobacco: Never Used  . Alcohol use No   Current Outpatient Prescriptions  Medication Sig Dispense Refill  . QUEtiapine (SEROQUEL XR) 50 MG TB24 24 hr tablet Take 50 mg by mouth daily.  2  . sucralfate (CARAFATE) 1 GM/10ML suspension Take 10 mLs (1 g total) by mouth 4 (four) times daily -  with meals and at bedtime. 420 mL 0   No current facility-administered medications for this visit.    No Known Allergies   Review of Systems: All systems reviewed and  negative except where noted in HPI.   Physical Exam: BP 108/60   Pulse 62   Ht 5\' 6"  (1.676 m)   Wt 204 lb (92.5 kg)   BMI 32.93 kg/m  Constitutional:  Well-developed, black female in no acute distress. Psychiatric: Normal mood and affect. Behavior is normal. EENT: Conjunctivae are normal. No scleral icterus. Neck supple.  Cardiovascular: Normal rate, regular rhythm.  Pulmonary/chest: Effort normal and breath sounds normal. No wheezing, rales or rhonchi. Abdominal: Soft, nondistended, nontender. Bowel sounds active throughout. There are no masses palpable. No hepatomegaly. Extremities: no edema Lymphadenopathy: No cervical adenopathy noted. Neurological: Alert and oriented to person place and time. Skin: Skin is warm and dry. No rashes noted.   ASSESSMENT AND PLAN:  36 yo female with evaluated in ED after waking up choking and short of breath. She has sensation of foreign body in throat. Negative soft tissue xray of neck . No hx of dysphagia. She does have hx of GERD, symptoms worse over last couple of weeks. This could be globus sensation.  -Omeprazole 20 mg q am, samples given.  -She is concerned and physically uncomfortable. We discussed barium swallow as well as an EGD. She would like to proceed with EGD at this point. Thursday is soonest available appt. Since she is able to eat /  drink I am comfortable with her waiting . If improving on PPI then patient will call us early next week to cancel the EGD. The risks and benefits of EGD were discussed and the patient agrees to proceed.    Willette ClusterPaula Scout Guyett, NP  10/21/2016, 10:09 AM   Agree with Ms. Dorice LamasGuenther's assessment and plan. Iva Booparl E. Gessner, MD, Clementeen GrahamFACG

## 2016-10-22 LAB — CULTURE, GROUP A STREP (THRC)

## 2016-10-27 ENCOUNTER — Ambulatory Visit (AMBULATORY_SURGERY_CENTER): Payer: 59 | Admitting: Internal Medicine

## 2016-10-27 ENCOUNTER — Encounter: Payer: Self-pay | Admitting: Internal Medicine

## 2016-10-27 VITALS — BP 141/98 | HR 69 | Temp 98.4°F | Resp 18 | Ht 66.0 in | Wt 204.0 lb

## 2016-10-27 DIAGNOSIS — F458 Other somatoform disorders: Secondary | ICD-10-CM | POA: Diagnosis not present

## 2016-10-27 DIAGNOSIS — K2100 Gastro-esophageal reflux disease with esophagitis, without bleeding: Secondary | ICD-10-CM

## 2016-10-27 DIAGNOSIS — R0989 Other specified symptoms and signs involving the circulatory and respiratory systems: Secondary | ICD-10-CM

## 2016-10-27 MED ORDER — SODIUM CHLORIDE 0.9 % IV SOLN: 500.0000 mL | INTRAVENOUS | Status: DC

## 2016-10-27 MED ORDER — PANTOPRAZOLE SODIUM 20 MG PO TBEC: 20.0000 mg | DELAYED_RELEASE_TABLET | Freq: Every day | ORAL | 1 refills | Status: DC

## 2016-10-27 NOTE — Progress Notes (Signed)
Pt's states no medical or surgical changes since previsit or office visit. 

## 2016-10-27 NOTE — Progress Notes (Signed)
To recovery, report to Brown, RN, VSS. 

## 2016-10-27 NOTE — Patient Instructions (Addendum)
There is some inflammation where the esophagus and stomach meet - acid reflux damage = reflux esophagitis. I understand you also have had some increasing heartburn and this is related.  Stop carafate or do not start that and I recommend taking pantoprazole 20 mg daily before breakfast for 2 months and trying to stop - work on lifestyle changes for GERD as indicated in the handout.  Please call back in June  to see Willette Cluster or Dr. Leone Payor in August   I appreciate the opportunity to care for you. Iva Boop, MD, FACG    YOU HAD AN ENDOSCOPIC PROCEDURE TODAY AT THE [facility]:   Refer to the procedure report that was given to you for any specific questions about what was found during the examination.  If the procedure report does not answer your questions, please call your gastroenterologist to clarify.  If you requested that your care partner not be given the details of your procedure findings, then the procedure report has been included in a sealed envelope for you to review at your convenience later.  YOU SHOULD EXPECT: Some feelings of bloating in the abdomen. Passage of more gas than usual.  Walking can help get rid of the air that was put into your GI tract during the procedure and reduce the bloating. If you had a lower endoscopy (such as a colonoscopy or flexible sigmoidoscopy) you may notice spotting of blood in your stool or on the toilet paper. If you underwent a bowel prep for your procedure, you may not have a normal bowel movement for a few days.  Please Note:  You might notice some irritation and congestion in your nose or some drainage.  This is from the oxygen used during your procedure.  There is no need for concern and it should clear up in a day or so.  SYMPTOMS TO REPORT IMMEDIATELY:     Following upper endoscopy (EGD)  Vomiting of blood or coffee ground material  New chest pain or pain under the shoulder blades  Painful or persistently  difficult swallowing  New shortness of breath  Fever of 100F or higher  Black, tarry-looking stools  For urgent or emergent issues, a gastroenterologist can be reached at any hour by calling [phone].   DIET:  We do recommend a small meal at first, but then you may proceed to your regular diet.  Drink plenty of fluids but you should avoid alcoholic beverages for 24 hours.  ACTIVITY:  You should plan to take it easy for the rest of today and you should NOT DRIVE or use heavy machinery until tomorrow (because of the sedation medicines used during the test).    FOLLOW UP: Our staff will call the number listed on your records the next business day following your procedure to check on you and address any questions or concerns that you may have regarding the information given to you following your procedure. If we do not reach you, we will leave a message.  However, if you are feeling well and you are not experiencing any problems, there is no need to return our call.  We will assume that you have returned to your regular daily activities without incident.  If any biopsies were taken you will be contacted by phone or by letter within the next 1-3 weeks.  Please call us at [phone] if you have not heard about the biopsies in 3 weeks.    SIGNATURES/CONFIDENTIALITY: You and/or your care  partner have signed paperwork which will be entered into your electronic medical record.  These signatures attest to the fact that that the information above on your After Visit Summary has been reviewed and is understood.  Full responsibility of the confidentiality of this discharge information lies with you and/or your care-partner.  Resume medication. Information given on Gerd and Esophagitis. Call to schedule follow for August.

## 2016-10-27 NOTE — Op Note (Signed)
[facility] Patient Name: Mary Joyce Procedure Date: 10/27/2016 8:12 AM MRN: [id number] Endoscopist: Iva Boop , MD Age: 36 Referring MD:  Date of Birth: 03-05-1981 Gender: Female Account #: [id number] Procedure:                Upper GI endoscopy Indications:              Heartburn, Globus sensation Medicines:                Propofol per Anesthesia, Monitored Anesthesia Care Procedure:                Pre-Anesthesia Assessment:                           - Prior to the procedure, a History and Physical                            was performed, and patient medications and                            allergies were reviewed. The patient's tolerance of                            previous anesthesia was also reviewed. The risks                            and benefits of the procedure and the sedation                            options and risks were discussed with the patient.                            All questions were answered, and informed consent                            was obtained. Prior Anticoagulants: The patient has                            taken no previous anticoagulant or antiplatelet                            agents. ASA Grade Assessment: II - A patient with                            mild systemic disease. After reviewing the risks                            and benefits, the patient was deemed in                            satisfactory condition to undergo the procedure.                           After obtaining informed consent, the endoscope was  passed under direct vision. Throughout the                            procedure, the patient's blood pressure, pulse, and                            oxygen saturations were monitored continuously. The                            Endoscope was introduced through the mouth, and                            advanced to the second part of duodenum. The                            Endoscope  was introduced through the mouth, and                            advanced to the. The Endoscope was introduced                            through the and advanced to the. The upper GI                            endoscopy was accomplished without difficulty. The                            patient tolerated the procedure well. Scope In: Scope Out: Findings:                 LA Grade A (one or more mucosal breaks less than 5                            mm, not extending between tops of 2 mucosal folds)                            esophagitis with no bleeding was found at the                            gastroesophageal junction.                           The exam was otherwise without abnormality.                           The cardia and gastric fundus were normal on                            retroflexion. Complications:            No immediate complications. Estimated Blood Loss:     Estimated blood loss: none. Impression:               - LA Grade A reflux esophagitis.                           -  The examination was otherwise normal.                           - No specimens collected. Recommendation:           - Patient has a contact number available for                            emergencies. The signs and symptoms of potential                            delayed complications were discussed with the                            patient. Return to normal activities tomorrow.                            Written discharge instructions were provided to the                            patient.                           - Resume previous diet.                           - Use Protonix (pantoprazole) 20 mg PO daily for 2                            months. then stop and see how she does                           - Follow an antireflux regimen indefinitely.                           - Return to nurse practitioner Willette Cluster in                            August (or me) call for appointment in June.                            - Continue present medications. Iva Boop, MD 10/27/2016 8:38:10 AM This report has been signed electronically.

## 2016-10-28 ENCOUNTER — Telehealth: Payer: Self-pay

## 2016-10-28 NOTE — Telephone Encounter (Signed)
  Follow up Call-  Call back number 10/27/2016  Post procedure Call Back phone  # [phone]  Permission to leave phone message Yes  Some recent data might be hidden     Patient questions:  Do you have a fever, pain , or abdominal swelling? No. Pain Score  0 *  Have you tolerated food without any problems? Yes.    Have you been able to return to your normal activities? Yes.    Do you have any questions about your discharge instructions: Diet   No. Medications  No. Follow up visit  No.  Do you have questions or concerns about your Care? No.  Actions: * If pain score is 4 or above: No action needed, pain <4.

## 2016-12-02 ENCOUNTER — Encounter: Payer: Self-pay | Admitting: Family Medicine

## 2016-12-02 MED ORDER — QUETIAPINE FUMARATE 100 MG PO TABS: 100.0000 mg | ORAL_TABLET | Freq: Every day | ORAL | 5 refills | Status: DC

## 2016-12-02 NOTE — Addendum Note (Signed)
Addended by: Abbe AmsterdamOPLAND, JESSICA C on: 12/02/2016 01:31 PM   Modules accepted: Orders

## 2017-06-03 ENCOUNTER — Encounter: Payer: Self-pay | Admitting: Family Medicine

## 2017-06-06 NOTE — Progress Notes (Deleted)
[facility] [address], [address] [place], [place] [address] [phone] [phone]  Date:  06/07/2017   Name:  Mary PonsRetha Stamps   DOB:  [birth year]   MRN:  [id number]  PCP:  Pearline Cablesopland, Jessica C, MD    Chief Complaint: No chief complaint on file.   History of Present Illness:  Mary PonsRetha Oloughlin is a 37 y.o. very pleasant female patient who presents with the following:  ***  Patient Active Problem List   Diagnosis Date Noted  . Obesity 07/31/2014  . MDD (major depressive disorder) 05/02/2013  . Anxiety and depression 02/20/2013    Past Medical History:  Diagnosis Date  . Anemia   . Anxiety   . Depression   . Fibromyalgia   . H/O menorrhagia   . History of keloid of skin   . History of uterine fibroid     Past Surgical History:  Procedure Laterality Date  . ABDOMINAL HYSTERECTOMY    . CESAREAN SECTION    . HYSTEROSCOPY  2008   with resection of fibroid   . LAPAROSCOPIC VAGINAL HYSTERECTOMY  2009  . TUBAL LIGATION Bilateral 2001    Social History   Tobacco Use  . Smoking status: Never Smoker  . Smokeless tobacco: Never Used  Substance Use Topics  . Alcohol use: No    Alcohol/week: 0.0 oz  . Drug use: No    Family History  Problem Relation Age of Onset  . Hypertension Mother   . Heart disease Father   . Congestive Heart Failure Father   . Cancer Maternal Grandmother   . Diabetes Unknown     No Known Allergies  Medication list has been reviewed and updated.  Current Outpatient Medications on File Prior to Visit  Medication Sig Dispense Refill  . pantoprazole (PROTONIX) 20 MG tablet Take 1 tablet (20 mg total) by mouth daily before breakfast. 30 tablet 1  . QUEtiapine (SEROQUEL) 100 MG tablet Take 1 tablet (100 mg total) by mouth at bedtime. 30 tablet 5   No current facility-administered medications on file prior to visit.     Review of Systems:  ***  Physical Examination: There were no vitals filed for this  visit. There were no vitals filed for this visit. There is no height or weight on file to calculate BMI. Ideal Body Weight:    ***  Assessment and Plan: ***  Signed Abbe AmsterdamJessica Copland, MD

## 2017-06-07 ENCOUNTER — Other Ambulatory Visit (HOSPITAL_COMMUNITY)
Admission: RE | Admit: 2017-06-07 | Discharge: 2017-06-07 | Disposition: A | Discharge status: Home / Self Care | Payer: 59 | Source: Ambulatory Visit | Attending: Family Medicine | Admitting: Family Medicine

## 2017-06-07 ENCOUNTER — Ambulatory Visit: Payer: 59 | Admitting: Family Medicine

## 2017-06-07 ENCOUNTER — Encounter: Payer: Self-pay | Admitting: Family Medicine

## 2017-06-07 ENCOUNTER — Ambulatory Visit: Payer: Self-pay | Admitting: Family Medicine

## 2017-06-07 ENCOUNTER — Other Ambulatory Visit: Payer: Self-pay | Admitting: Family Medicine

## 2017-06-07 VITALS — BP 130/80 | HR 69 | Temp 98.3°F | Ht 66.0 in | Wt 201.0 lb

## 2017-06-07 DIAGNOSIS — Z131 Encounter for screening for diabetes mellitus: Secondary | ICD-10-CM

## 2017-06-07 DIAGNOSIS — N939 Abnormal uterine and vaginal bleeding, unspecified: Secondary | ICD-10-CM

## 2017-06-07 DIAGNOSIS — R040 Epistaxis: Secondary | ICD-10-CM

## 2017-06-07 DIAGNOSIS — Z13220 Encounter for screening for lipoid disorders: Secondary | ICD-10-CM

## 2017-06-07 LAB — LIPID PANEL
Cholesterol: 177 mg/dL (ref 0–200)
HDL: 39.3 mg/dL (ref >39.00)
LDL Cholesterol: 119 mg/dL — ABNORMAL HIGH (ref 0–99)
NONHDL: 137.95
Total CHOL/HDL Ratio: 5
Triglycerides: 93 mg/dL (ref 0.0–149.0)
VLDL: 18.6 mg/dL (ref 0.0–40.0)

## 2017-06-07 LAB — CBC
HEMATOCRIT: 37.6 % (ref 36.0–46.0)
Hemoglobin: 12.1 g/dL (ref 12.0–15.0)
MCHC: 32.1 g/dL (ref 30.0–36.0)
MCV: 76.5 fl — ABNORMAL LOW (ref 78.0–100.0)
Platelets: 381 10*3/uL (ref 150.0–400.0)
RBC: 4.92 Mil/uL (ref 3.87–5.11)
RDW: 16 % — AB (ref 11.5–15.5)
WBC: 5.8 10*3/uL (ref 4.0–10.5)

## 2017-06-07 LAB — COMPREHENSIVE METABOLIC PANEL
ALK PHOS: 65 U/L (ref 39–117)
ALT: 16 U/L (ref 0–35)
AST: 12 U/L (ref 0–37)
Albumin: 4.4 g/dL (ref 3.5–5.2)
BUN: 10 mg/dL (ref 6–23)
CO2: 26 mEq/L (ref 19–32)
CREATININE: 0.54 mg/dL (ref 0.40–1.20)
Calcium: 9 mg/dL (ref 8.4–10.5)
Chloride: 105 mEq/L (ref 96–112)
GFR: 161.68 mL/min (ref >60.00)
GLUCOSE: 102 mg/dL — AB (ref 70–99)
POTASSIUM: 4.1 meq/L (ref 3.5–5.1)
SODIUM: 138 meq/L (ref 135–145)
TOTAL PROTEIN: 7.7 g/dL (ref 6.0–8.3)
Total Bilirubin: 0.4 mg/dL (ref 0.2–1.2)

## 2017-06-07 LAB — HEMOGLOBIN A1C: HEMOGLOBIN A1C: 5.5 % (ref 4.6–6.5)

## 2017-06-07 MED ORDER — METRONIDAZOLE 500 MG PO TABS: 500.0000 mg | ORAL_TABLET | Freq: Two times a day (BID) | ORAL | 0 refills | Status: DC

## 2017-06-07 NOTE — Patient Instructions (Signed)
It was good to see you today! I will be in touch with your labs asap I did send in an rx for flagyl for you to take twice a day for one week- this will treat bacterial vaginosis and trich just in case We will do labs today to look for any cause of bleeding as well

## 2017-06-07 NOTE — Progress Notes (Signed)
Hysterectomy

## 2017-06-07 NOTE — Progress Notes (Addendum)
[facility] [address] [place], [place] [address] [phone]  Date:  06/07/2017   Name:  Mary Joyce   DOB:  June 20, 1980   MRN:  [id number]  PCP:  Pearline Cables, MD    Chief Complaint: Vaginal Bleeding (c/o light vaginal bleeding on this past Saturday and spotting on Monday. Pt also had nose bleed on yesterday. Had hysterectomy in 2009. )   History of Present Illness:  Mary Joyce is a 36 y.o. very pleasant female patient who presents with the following:  History of hysterectomy- done in 2009  She notes some light bleeding from her vagina this past Saturday and noted some burning  Reddish in color, but not heavy.   She also had a little spotting yesterday Some itching No odor or discharge noted  She also had a nosebleed yesterday, spontaneous. It bled for a few minutes and she was able to get it under control No bruising  No bleeding gums. No blood in her stool or urine No family history of any bleeding disorder She does not take aspirin or NSAIDs on a regular basis   No history of GYN cancer  She has had a new partner for the last 6 months  She has noted more frequent urination but this is not new, it is not unusual for her   No belly pain  We did a pap of the vaginal cuff in 2016- negative, HPV negative  Patient Active Problem List   Diagnosis Date Noted  . Obesity 07/31/2014  . MDD (major depressive disorder) 05/02/2013  . Anxiety and depression 02/20/2013    Past Medical History:  Diagnosis Date  . Anemia   . Anxiety   . Depression   . Fibromyalgia   . H/O menorrhagia   . History of keloid of skin   . History of uterine fibroid     Past Surgical History:  Procedure Laterality Date  . ABDOMINAL HYSTERECTOMY    . CESAREAN SECTION    . HYSTEROSCOPY  2008   with resection of fibroid   . LAPAROSCOPIC VAGINAL HYSTERECTOMY  2009  . TUBAL LIGATION Bilateral 2001    Social History    Tobacco Use  . Smoking status: Never Smoker  . Smokeless tobacco: Never Used  Substance Use Topics  . Alcohol use: No    Alcohol/week: 0.0 oz  . Drug use: No    Family History  Problem Relation Age of Onset  . Hypertension Mother   . Heart disease Father   . Congestive Heart Failure Father   . Cancer Maternal Grandmother   . Diabetes Unknown     No Known Allergies  Medication list has been reviewed and updated.  Current Outpatient Medications on File Prior to Visit  Medication Sig Dispense Refill  . QUEtiapine (SEROQUEL) 100 MG tablet Take 1 tablet (100 mg total) by mouth at bedtime. 30 tablet 5   No current facility-administered medications on file prior to visit.     Review of Systems:  As per HPI- otherwise negative. No fever or chills No belly pain BP Readings from Last 3 Encounters:  06/07/17 (!) 134/94  10/27/16 (!) 141/98  10/21/16 108/60      Physical Examination: Vitals:   06/07/17 1035  BP: (!) 141/96  Pulse: 69  Temp: 98.3 F (36.8 C)  SpO2: 100%   Vitals:   06/07/17 1035  Weight: 201 lb (91.2 kg)  Height: 5\' 6"  (  1.676 m)   Body mass index is 32.44 kg/m. Ideal Body Weight: Weight in (lb) to have BMI = 25: 154.6  GEN: WDWN, NAD, Non-toxic, A & O x 3, obese, looks well HEENT: Atraumatic, Normocephalic. Neck supple. No masses, No LAD.  Bilateral TM wnl, oropharynx normal.  PEERL,EOMI.   Conjunctivae a bit pale Ears and Nose: No external deformity. CV: RRR, No M/G/R. No JVD. No thrill. No extra heart sounds. PULM: CTA B, no wheezes, crackles, rhonchi. No retractions. No resp. distress. No accessory muscle use. ABD: S, NT, ND, +BS. No rebound. No HSM. EXTR: No c/c/e NEURO Normal gait.  PSYCH: Normally interactive. Conversant. Not depressed or anxious appearing.  Calm demeanor.  Pelvic: normal externally.  Vaginal vault is inflamed and there is a fishy odor.  no vaginal lesions or discharge. S/p hysterectomy, cervix is absent. No adnexal  tendernes   Assessment and Plan: Vaginal bleeding - Plan: Cytology - PAP, metroNIDAZOLE (FLAGYL) 500 MG tablet  Nosebleed - Plan: CBC  Screening for diabetes mellitus - Plan: Comprehensive metabolic panel, Hemoglobin A1c  Screening for hyperlipidemia - Plan: Lipid panel  Here today with a couple of concerns.   She had just a shake so far today so we will obtain labs as above Wonder if she may have vaginal trich- await pap and ancillary results as above rx for flagyl Will plan further follow- up pending labs.   Signed Abbe Amsterdam, MD  Received blood work- message to pt Pap still pending   Received her pap 1/4- message to pt   The pap part of the test- screening for cervical cancer- is negative, and you are negative for HPV You are positive for trichomonas vaginalis- this is an STD which is cured by the medication we prescribed at our visit.  Your partner will also need to be treated so you don't pass it back and forth.   Negative for gonorrhea and chlamydia, and negative for yeast You also have gardnerella vaginalis but this is a normal vaginal bacteria and not a problem  Please let me know if you have any questions about this infection, and I hope that you are feeling better!  Results for orders placed or performed in visit on 06/07/17  CBC  Result Value Ref Range   WBC 5.8 4.0 - 10.5 K/uL   RBC 4.92 3.87 - 5.11 Mil/uL   Platelets 381.0 150.0 - 400.0 K/uL   Hemoglobin 12.1 12.0 - 15.0 g/dL   HCT 16.1 09.6 - 04.5 %   MCV 76.5 (L) 78.0 - 100.0 fl   MCHC 32.1 30.0 - 36.0 g/dL   RDW 40.9 (H) 81.1 - 91.4 %  Comprehensive metabolic panel  Result Value Ref Range   Sodium 138 135 - 145 mEq/L   Potassium 4.1 3.5 - 5.1 mEq/L   Chloride 105 96 - 112 mEq/L   CO2 26 19 - 32 mEq/L   Glucose, Bld 102 (H) 70 - 99 mg/dL   BUN 10 6 - 23 mg/dL   Creatinine, Ser 7.82 0.40 - 1.20 mg/dL   Total Bilirubin 0.4 0.2 - 1.2 mg/dL   Alkaline Phosphatase 65 39 - 117 U/L   AST 12 0 - 37  U/L   ALT 16 0 - 35 U/L   Total Protein 7.7 6.0 - 8.3 g/dL   Albumin 4.4 3.5 - 5.2 g/dL   Calcium 9.0 8.4 - 95.6 mg/dL   GFR 213.08 >65.78 mL/min  Lipid panel  Result Value Ref Range  Cholesterol 177 0 - 200 mg/dL   Triglycerides 16.193.0 0.0 - 149.0 mg/dL   HDL 09.6039.30 >45.40>39.00 mg/dL   VLDL 98.118.6 0.0 - 19.140.0 mg/dL   LDL Cholesterol 478119 (H) 0 - 99 mg/dL   Total CHOL/HDL Ratio 5    NonHDL 137.95   Hemoglobin A1c  Result Value Ref Range   Hgb A1c MFr Bld 5.5 4.6 - 6.5 %  Cytology - PAP  Result Value Ref Range   Adequacy Satisfactory for evaluation.    Diagnosis      NEGATIVE FOR INTRAEPITHELIAL LESIONS OR MALIGNANCY.   Diagnosis TRICHOMONAS VAGINALIS PRESENT.    Bacterial vaginitis POSITIVE for Gardnerella vaginalis (A)    Candida vaginitis Negative for Candida species    Chlamydia Negative    Neisseria gonorrhea Negative    HPV NOT DETECTED    Trichomonas POSITIVE (A)    Material Submitted Vaginal Pap [ThinPrep Imaged]    CYTOLOGY - PAP PAP RESULT

## 2017-06-09 ENCOUNTER — Encounter: Payer: Self-pay | Admitting: Family Medicine

## 2017-06-09 LAB — CYTOLOGY - PAP
Bacterial vaginitis: POSITIVE — AB
CANDIDA VAGINITIS: NEGATIVE
CHLAMYDIA, DNA PROBE: NEGATIVE
DIAGNOSIS: NEGATIVE
HPV: NOT DETECTED
Neisseria Gonorrhea: NEGATIVE
Trichomonas: POSITIVE — AB

## 2017-06-13 NOTE — Telephone Encounter (Unsigned)
Copied from CRM [id number]#[id number]. Topic: Quick Communication - Lab Results >> Jun 09, 2017  3:29 PM Oneal GroutSebastian, Jennifer S wrote: Requesting PAP results, ok to send Mychart message

## 2017-07-11 ENCOUNTER — Other Ambulatory Visit: Payer: Self-pay | Admitting: Family Medicine

## 2017-08-08 ENCOUNTER — Other Ambulatory Visit: Payer: Self-pay | Admitting: Family Medicine

## 2017-09-06 ENCOUNTER — Encounter: Payer: Self-pay | Admitting: Family Medicine

## 2017-10-11 ENCOUNTER — Ambulatory Visit: Payer: Self-pay

## 2017-10-11 ENCOUNTER — Encounter: Payer: Self-pay | Admitting: Family Medicine

## 2017-10-11 ENCOUNTER — Ambulatory Visit: Payer: 59 | Admitting: Family Medicine

## 2017-10-11 VITALS — BP 124/82 | HR 66 | Temp 98.2°F | Ht 66.0 in | Wt 198.2 lb

## 2017-10-11 DIAGNOSIS — R42 Dizziness and giddiness: Secondary | ICD-10-CM | POA: Diagnosis not present

## 2017-10-11 LAB — COMPREHENSIVE METABOLIC PANEL
ALBUMIN: 4.4 g/dL (ref 3.5–5.2)
ALK PHOS: 62 U/L (ref 39–117)
ALT: 14 U/L (ref 0–35)
AST: 13 U/L (ref 0–37)
BUN: 10 mg/dL (ref 6–23)
CALCIUM: 9.3 mg/dL (ref 8.4–10.5)
CO2: 27 mEq/L (ref 19–32)
CREATININE: 0.5 mg/dL (ref 0.40–1.20)
Chloride: 102 mEq/L (ref 96–112)
GFR: 176.38 mL/min (ref >60.00)
Glucose, Bld: 84 mg/dL (ref 70–99)
Potassium: 4.1 mEq/L (ref 3.5–5.1)
Sodium: 136 mEq/L (ref 135–145)
TOTAL PROTEIN: 7.8 g/dL (ref 6.0–8.3)
Total Bilirubin: 0.6 mg/dL (ref 0.2–1.2)

## 2017-10-11 LAB — CBC
HCT: 37.4 % (ref 36.0–46.0)
Hemoglobin: 12.2 g/dL (ref 12.0–15.0)
MCHC: 32.6 g/dL (ref 30.0–36.0)
MCV: 76.1 fl — AB (ref 78.0–100.0)
PLATELETS: 383 10*3/uL (ref 150.0–400.0)
RBC: 4.91 Mil/uL (ref 3.87–5.11)
RDW: 15.4 % (ref 11.5–15.5)
WBC: 5.6 10*3/uL (ref 4.0–10.5)

## 2017-10-11 LAB — TSH: TSH: 1.73 u[IU]/mL (ref 0.35–4.50)

## 2017-10-11 MED ORDER — PROMETHAZINE HCL 12.5 MG PO TABS: 12.5000 mg | ORAL_TABLET | Freq: Three times a day (TID) | ORAL | 0 refills | Status: DC | PRN

## 2017-10-11 NOTE — Patient Instructions (Addendum)
I think that you have vertigo realated to your inner ear- however, we are going to check some labs and make sure that all looks well.   I will be in touch with your labs asap You can use the phenergan as needed. However remember it can make you drowsy so do not drive while you are taking it.

## 2017-10-11 NOTE — Telephone Encounter (Signed)
Pt. Reports periods of dizziness x 1 week. States "one time the room was spinning." " It hits me out of nowhere." No other symptoms. Had an appointment for tomorrow, but request to be seen today if possible.  Reason for Disposition . [1] MODERATE dizziness (e.g., interferes with normal activities) AND [2] has NOT been evaluated by physician for this  (Exception: dizziness caused by heat exposure, sudden standing, or poor fluid intake)  Answer Assessment - Initial Assessment Questions 1. DESCRIPTION: "Describe your dizziness."     Dizzy 2. LIGHTHEADED: "Do you feel lightheaded?" (e.g., somewhat faint, woozy, weak upon standing)     Hits me out of nowhere 3. VERTIGO: "Do you feel like either you or the room is spinning or tilting?" (i.e. vertigo)     No 4. SEVERITY: "How bad is it?"  "Do you feel like you are going to faint?" "Can you stand and walk?"   - MILD - walking normally   - MODERATE - interferes with normal activities (e.g., work, school)    - SEVERE - unable to stand, requires support to walk, feels like passing out now.      Moderate 5. ONSET:  "When did the dizziness begin?"      1 week ago 6. AGGRAVATING FACTORS: "Does anything make it worse?" (e.g., standing, change in head position)     Moving 7. HEART RATE: "Can you tell me your heart rate?" "How many beats in 15 seconds?"  (Note: not all patients can do this)       No 8. CAUSE: "What do you think is causing the dizziness?"     Unsure 9. RECURRENT SYMPTOM: "Have you had dizziness before?" If so, ask: "When was the last time?" "What happened that time?"     No 10. OTHER SYMPTOMS: "Do you have any other symptoms?" (e.g., fever, chest pain, vomiting, diarrhea, bleeding)       No 11. PREGNANCY: "Is there any chance you are pregnant?" "When was your last menstrual period?"       No  Protocols used: DIZZINESS [facility]

## 2017-10-11 NOTE — Telephone Encounter (Signed)
FYI to PCP

## 2017-10-11 NOTE — Progress Notes (Signed)
[facility] [address], [address] [place], [place] [address] [phone]  Date:  10/11/2017   Name:  Mary Joyce   DOB:  1980-06-25   MRN:  [id number]  PCP:  Pearline Cables, MD    Chief Complaint: Dizziness (c/o feeling dizzy x 1 week. )   History of Present Illness:  Mary Joyce is a 37 y.o. very pleasant female patient who presents with the following:  She is here today with dizziness and lightheadedness She first noted this a week ago Sx started when she was in a store shopping.  She looked up and something and noted sudden onset of vertigo sensation.   The vertigo has been coming and going but she has felt lightheaded all the time over the last week  She has not had this in the past except for when she was anemic prior to her hysterectomy.  At that time she was given phenergan which did help with her sx  No new exercise program No new diet No new meds She has not slept well the last few days but is not sure why  No significant headache No belly pain She may have nausea if she is really dizzy but otherwise ok She is eating normally   She cannot think of any other symptoms at this time  She has not noted any palpitations or racing heart  No change in her vision or hearing   Patient Active Problem List   Diagnosis Date Noted  . Obesity 07/31/2014  . MDD (major depressive disorder) 05/02/2013  . Anxiety and depression 02/20/2013    Past Medical History:  Diagnosis Date  . Anemia   . Anxiety   . Depression   . Fibromyalgia   . H/O menorrhagia   . History of keloid of skin   . History of uterine fibroid     Past Surgical History:  Procedure Laterality Date  . ABDOMINAL HYSTERECTOMY    . CESAREAN SECTION    . HYSTEROSCOPY  2008   with resection of fibroid   . LAPAROSCOPIC VAGINAL HYSTERECTOMY  2009  . TUBAL LIGATION Bilateral 2001    Social History   Tobacco Use  . Smoking status: Never Smoker   . Smokeless tobacco: Never Used  Substance Use Topics  . Alcohol use: No    Alcohol/week: 0.0 oz  . Drug use: No    Family History  Problem Relation Age of Onset  . Hypertension Mother   . Heart disease Father   . Congestive Heart Failure Father   . Cancer Maternal Grandmother   . Diabetes Unknown     No Known Allergies  Medication list has been reviewed and updated.  Current Outpatient Medications on File Prior to Visit  Medication Sig Dispense Refill  . QUEtiapine (SEROQUEL) 100 MG tablet Take 1 tablet (100 mg total) by mouth at bedtime. 30 tablet 5   No current facility-administered medications on file prior to visit.     Review of Systems:  As per HPI- otherwise negative.   Physical Examination: Vitals:   10/11/17 1253  BP: 124/82  Pulse: 66  Temp: 98.2 F (36.8 C)  SpO2: 97%   Vitals:   10/11/17 1253  Weight: 198 lb 3.2 oz (89.9 kg)  Height:  (1.676 m)   Body mass index is 31.99 kg/m. Ideal Body Weight: Weight in (lb) to have BMI = 25: 154.6  GEN: WDWN, NAD, Non-toxic, A &  O x 3, obese, looks well  HEENT: Atraumatic, Normocephalic. Neck supple. No masses, No LAD.  Bilateral TM wnl, oropharynx normal.  PEERL,EOMI.   Ears and Nose: No external deformity. CV: RRR, No M/G/R. No JVD. No thrill. No extra heart sounds. PULM: CTA B, no wheezes, crackles, rhonchi. No retractions. No resp. distress. No accessory muscle use. ABD: S, NT, ND. No rebound. No HSM. EXTR: No c/c/e NEURO Normal gait.  PSYCH: Normally interactive. Conversant. Not depressed or anxious appearing.  Calm demeanor.  Positive dix halpike Normal strength, sensation and DTR of all limbs. negative romberg, normal facial movement   Orthostatic VS for the past 24 hrs:  BP- Lying Pulse- Lying BP- Sitting Pulse- Sitting BP- Standing at 0 minutes Pulse- Standing at 0 minutes  10/11/17 1336 127/83 58 129/90 60 (!) 129/97 60     Assessment and Plan: Vertigo - Plan: CBC, Comprehensive  metabolic panel, TSH, promethazine (PHENERGAN) 12.5 MG tablet  Here today with sx c/w BPPV, but also more persistent lightheaded feeling.  Explained that I do not have an explanation for her lightheadedness, and offered to have her seen in the ER today.  She declines, but will let me know if not feeling better in the next couple of days.  She will alert me or otherwise seek help sooner if she gets worse  She has used phenergan for this issue in the past, refilled for her today.  Cautioned regarding sedation  Meds ordered this encounter  Medications  . promethazine (PHENERGAN) 12.5 MG tablet    Sig: Take 1 tablet (12.5 mg total) by mouth every 8 (eight) hours as needed for nausea or vomiting. Or dizziness    Dispense:  20 tablet    Refill:  0     Signed Abbe Amsterdam, MD  Received her labs, message to pt Results for orders placed or performed in visit on 10/11/17  CBC  Result Value Ref Range   WBC 5.6 4.0 - 10.5 K/uL   RBC 4.91 3.87 - 5.11 Mil/uL   Platelets 383.0 150.0 - 400.0 K/uL   Hemoglobin 12.2 12.0 - 15.0 g/dL   HCT 91.4 78.2 - 95.6 %   MCV 76.1 (L) 78.0 - 100.0 fl   MCHC 32.6 30.0 - 36.0 g/dL   RDW 21.3 08.6 - 57.8 %  Comprehensive metabolic panel  Result Value Ref Range   Sodium 136 135 - 145 mEq/L   Potassium 4.1 3.5 - 5.1 mEq/L   Chloride 102 96 - 112 mEq/L   CO2 27 19 - 32 mEq/L   Glucose, Bld 84 70 - 99 mg/dL   BUN 10 6 - 23 mg/dL   Creatinine, Ser 4.69 0.40 - 1.20 mg/dL   Total Bilirubin 0.6 0.2 - 1.2 mg/dL   Alkaline Phosphatase 62 39 - 117 U/L   AST 13 0 - 37 U/L   ALT 14 0 - 35 U/L   Total Protein 7.8 6.0 - 8.3 g/dL   Albumin 4.4 3.5 - 5.2 g/dL   Calcium 9.3 8.4 - 62.9 mg/dL   GFR 528.41 >32.44 mL/min  TSH  Result Value Ref Range   TSH 1.73 0.35 - 4.50 uIU/mL

## 2017-10-12 ENCOUNTER — Ambulatory Visit: Payer: 59 | Admitting: Family Medicine

## 2018-01-10 ENCOUNTER — Ambulatory Visit: Payer: Self-pay

## 2018-01-10 ENCOUNTER — Telehealth: Payer: Self-pay

## 2018-01-10 NOTE — Telephone Encounter (Signed)
Patient on scheduled for 01/15/18

## 2018-01-10 NOTE — Telephone Encounter (Signed)
Pt. Reports 2 days of dizziness/lightheadedness. Has developed nausea and vomiting with this. Denies vertigo. "I tried that medicine I got in May, but it didn't work." Vomited 3 times yesterday. States she will only see Dr. Patsy Lageropland. No availability with Dr. Patsy Lageropland. Offered appointment with another provider. Refuses.States the dizziness "is constant."  "Nothing makes it better." Please advise pt.  Reason for Disposition . [1] MODERATE dizziness (e.g., interferes with normal activities) AND [2] has NOT been evaluated by physician for this  (Exception: dizziness caused by heat exposure, sudden standing, or poor fluid intake)  Answer Assessment - Initial Assessment Questions 1. DESCRIPTION: "Describe your dizziness."     Lightheaded 2. LIGHTHEADED: "Do you feel lightheaded?" (e.g., somewhat faint, woozy, weak upon standing)     Yes 3. VERTIGO: "Do you feel like either you or the room is spinning or tilting?" (i.e. vertigo)     No 4. SEVERITY: "How bad is it?"  "Do you feel like you are going to faint?" "Can you stand and walk?"   - MILD - walking normally   - MODERATE - interferes with normal activities (e.g., work, school)    - SEVERE - unable to stand, requires support to walk, feels like passing out now.      Constant 5. ONSET:  "When did the dizziness begin?"      2 days ago 6. AGGRAVATING FACTORS: "Does anything make it worse?" (e.g., standing, change in head position)     Dizziness is constant 7. HEART RATE: "Can you tell me your heart rate?" "How many beats in 15 seconds?"  (Note: not all patients can do this)       No 8. CAUSE: "What do you think is causing the dizziness?"     Unsure 9. RECURRENT SYMPTOM: "Have you had dizziness before?" If so, ask: "When was the last time?" "What happened that time?"     Yes - in May 10. OTHER SYMPTOMS: "Do you have any other symptoms?" (e.g., fever, chest pain, vomiting, diarrhea, bleeding)       Nausea and vomiting 11. PREGNANCY: "Is there any  chance you are pregnant?" "When was your last menstrual period?"       No  Protocols used: DIZZINESS [facility]- LIGHTHEADEDNESS-A-AH

## 2018-01-10 NOTE — Telephone Encounter (Signed)
Copied from CRM [id number]. Topic: General - Other >> Jan 10, 2018  2:10 PM Percival SpanishKennedy, Cheryl W wrote:  Pt caleld back to folowup on her request to see Dr Patsy Lageropland. She said if she can't see her today she would like to see her tomorrow

## 2018-01-10 NOTE — Telephone Encounter (Signed)
Patient wanted to see pcp next week. I have scheduled her for 01/15/18 at 12:45. Advised her to go to ED for evaluation if symptoms worsen.

## 2018-01-10 NOTE — Telephone Encounter (Signed)
Please let her know that I am very sorry but I am 100% full tomorrow. It sounds like she might need to be seen at the ER or by another provider this week Please seek care with another provider or I can see her next week

## 2018-01-13 NOTE — Progress Notes (Addendum)
[facility] [address], [address] [place], [place] [address] [phone]  Date:  01/15/2018   Name:  Mary Joyce   DOB:  July 15, 1980   MRN:  [id number]  PCP:  Mary Cables, MD    Chief Complaint: Dizziness   History of Present Illness:  Mary Joyce is a 37 y.o. very pleasant female patient who presents with the following:  Here today with complaint of vertigo/ dizziness-  History of obesity and depression Last seen here in May for vertigo:  She is here today with dizziness and lightheadedness She first noted this a week ago Sx started when she was in a store shopping.  She looked up and something and noted sudden onset of vertigo sensation.   The vertigo has been coming and going but she has felt lightheaded all the time over the last week She has not had this in the past except for when she was anemic prior to her hysterectomy.  At that time she was given phenergan which did help with her sx  No new exercise program No new diet No new meds She has not slept well the last few days but is not sure why  No significant headache No belly pain She may have nausea if she is really dizzy but otherwise ok She is eating normally   She cannot think of any other symptoms at this time  She has not noted any palpitations or racing heart  No change in her vision or hearing   She had called last week asking to be seen for dizziness. I was full and suggested that she be seen with a different provider or UC, but she preferred to wait for an appt today  Pt notes that a week ago today- last Monday- she felt dizzy/ vertigo, lightheaded, nausea.  She vomited once on Monday a week ago, and several times on Tuesday. She was not able to go to work mon/ Tuesday.  Wednesday and Thursday she was lightheaded. Saturday she was improving  This seems different than the dizziness she had in May- lightheadedness is the main issue She feels  best if she lies down She has had headaches- nothing consistent however  No diarrhea She is not aware of having gotten dehydrated   We did labs for her in May of this year She got back to normal between last May and now  She used to get dizzy due to anemia but this resolved since her hysterectomy  She does use seroquel for sleep but she is not sleeping as well as she used to  On nights that she sleeps well she feels better No slurred speech, facial drooping, etc  Patient Active Problem List   Diagnosis Date Noted  . Obesity 07/31/2014  . MDD (major depressive disorder) 05/02/2013  . Anxiety and depression 02/20/2013    Past Medical History:  Diagnosis Date  . Anemia   . Anxiety   . Depression   . Fibromyalgia   . H/O menorrhagia   . History of keloid of skin   . History of uterine fibroid     Past Surgical History:  Procedure Laterality Date  . ABDOMINAL HYSTERECTOMY    . CESAREAN SECTION    . HYSTEROSCOPY  2008   with resection of fibroid   . LAPAROSCOPIC VAGINAL HYSTERECTOMY  2009  . TUBAL LIGATION Bilateral 2001    Social History   Tobacco Use  . Smoking status:  Never Smoker  . Smokeless tobacco: Never Used  Substance Use Topics  . Alcohol use: No    Alcohol/week: 0.0 standard drinks  . Drug use: No    Family History  Problem Relation Age of Onset  . Hypertension Mother   . Heart disease Father   . Congestive Heart Failure Father   . Cancer Maternal Grandmother   . Diabetes Unknown     No Known Allergies  Medication list has been reviewed and updated.  Current Outpatient Medications on File Prior to Visit  Medication Sig Dispense Refill  . promethazine (PHENERGAN) 12.5 MG tablet Take 1 tablet (12.5 mg total) by mouth every 8 (eight) hours as needed for nausea or vomiting. Or dizziness 20 tablet 0  . QUEtiapine (SEROQUEL) 100 MG tablet Take 1 tablet (100 mg total) by mouth at bedtime. 30 tablet 5   No current facility-administered medications on  file prior to visit.     Review of Systems:  As per HPI- otherwise negative. She has not noted any CP or SOB   Physical Examination: Vitals:   01/15/18 1235  BP: 118/82  Pulse: 74  Resp: 16  Temp: 99 F (37.2 C)   Vitals:   01/15/18 1235  Weight: 194 lb 2 oz (88.1 kg)  Height: 5\' 6"  (1.676 m)   Body mass index is 31.33 kg/m. Ideal Body Weight: Weight in (lb) to have BMI = 25: 154.6  GEN: WDWN, NAD, Non-toxic, A & O x 3, looks well, overweight HEENT: Atraumatic, Normocephalic. Neck supple. No masses, No LAD.  Bilateral TM wnl, oropharynx normal.  PEERL,EOMI.   Ears and Nose: No external deformity. CV: RRR, No M/G/R. No JVD. No thrill. No extra heart sounds. PULM: CTA B, no wheezes, crackles, rhonchi. No retractions. No resp. distress. No accessory muscle use. ABD: S, NT, ND, +BS. No rebound. No HSM. EXTR: No c/c/e NEURO Normal gait.  Normal strength, sensation and DTR of all extremities, normal romberg. Normal RAM of hands  PSYCH: Normally interactive. Conversant. Not depressed or anxious appearing.  Calm demeanor.   EKG; SR, compared with older EKG from 12/17 no significant or concerning change noted.   No ST elevation or depression  See orthostatic vitals today also- negative Assessment and Plan: Lightheaded - Plan: EKG 12-Lead, CBC, Comprehensive metabolic panel, Ambulatory referral to Neurology  History of iron deficiency - Plan: CBC, Ferritin  Here today with feeling of lightheadedness for a week On exam today she looks well, and sx are improving Will obtain labs as above and refer to neurology as she has had a few bouts of dizziness/ vertigo Asked her to please seek care if any worsening or change in her symptoms  Signed Mary Amsterdam, MD  Received her labs 8/13 Results for orders placed or performed in visit on 01/15/18  CBC  Result Value Ref Range   WBC 6.8 4.0 - 10.5 K/uL   RBC 4.81 3.87 - 5.11 Mil/uL   Platelets 385.0 150.0 - 400.0 K/uL    Hemoglobin 12.1 12.0 - 15.0 g/dL   HCT 09.8 11.9 - 14.7 %   MCV 76.8 (L) 78.0 - 100.0 fl   MCHC 32.9 30.0 - 36.0 g/dL   RDW 82.9 (H) 56.2 - 13.0 %  Comprehensive metabolic panel  Result Value Ref Range   Sodium 138 135 - 145 mEq/L   Potassium 4.0 3.5 - 5.1 mEq/L   Chloride 103 96 - 112 mEq/L   CO2 29 19 - 32 mEq/L   Glucose,  Bld 106 (H) 70 - 99 mg/dL   BUN 7 6 - 23 mg/dL   Creatinine, Ser 1.610.64 0.40 - 1.20 mg/dL   Total Bilirubin 0.6 0.2 - 1.2 mg/dL   Alkaline Phosphatase 66 39 - 117 U/L   AST 11 0 - 37 U/L   ALT 15 0 - 35 U/L   Total Protein 7.5 6.0 - 8.3 g/dL   Albumin 4.4 3.5 - 5.2 g/dL   Calcium 9.7 8.4 - 09.610.5 mg/dL   GFR 045.40132.48 >98.11>60.00 mL/min  Ferritin  Result Value Ref Range   Ferritin 185.7 10.0 - 291.0 ng/mL    Message to pt: Your blood counts are ok- your MCV is slightly low but this is typical for you.  I do not expect this to cause any symptoms or problems. Metabolic profile is normal Iron- ferritin- is also normal

## 2018-01-15 ENCOUNTER — Ambulatory Visit (INDEPENDENT_AMBULATORY_CARE_PROVIDER_SITE_OTHER): Payer: 59 | Admitting: Family Medicine

## 2018-01-15 ENCOUNTER — Encounter: Payer: Self-pay | Admitting: Family Medicine

## 2018-01-15 VITALS — Temp 99.0°F | Resp 16 | Ht 66.0 in | Wt 194.1 lb

## 2018-01-15 DIAGNOSIS — R42 Dizziness and giddiness: Secondary | ICD-10-CM | POA: Diagnosis not present

## 2018-01-15 DIAGNOSIS — Z8639 Personal history of other endocrine, nutritional and metabolic disease: Secondary | ICD-10-CM | POA: Diagnosis not present

## 2018-01-15 LAB — COMPREHENSIVE METABOLIC PANEL
ALT: 15 U/L (ref 0–35)
AST: 11 U/L (ref 0–37)
Albumin: 4.4 g/dL (ref 3.5–5.2)
Alkaline Phosphatase: 66 U/L (ref 39–117)
BILIRUBIN TOTAL: 0.6 mg/dL (ref 0.2–1.2)
BUN: 7 mg/dL (ref 6–23)
CO2: 29 mEq/L (ref 19–32)
Calcium: 9.7 mg/dL (ref 8.4–10.5)
Chloride: 103 mEq/L (ref 96–112)
Creatinine, Ser: 0.64 mg/dL (ref 0.40–1.20)
GFR: 132.48 mL/min (ref >60.00)
Glucose, Bld: 106 mg/dL — ABNORMAL HIGH (ref 70–99)
Potassium: 4 mEq/L (ref 3.5–5.1)
Sodium: 138 mEq/L (ref 135–145)
Total Protein: 7.5 g/dL (ref 6.0–8.3)

## 2018-01-15 LAB — FERRITIN: Ferritin: 185.7 ng/mL (ref 10.0–291.0)

## 2018-01-15 LAB — CBC
HCT: 37 % (ref 36.0–46.0)
HEMOGLOBIN: 12.1 g/dL (ref 12.0–15.0)
MCHC: 32.9 g/dL (ref 30.0–36.0)
MCV: 76.8 fl — ABNORMAL LOW (ref 78.0–100.0)
Platelets: 385 10*3/uL (ref 150.0–400.0)
RBC: 4.81 Mil/uL (ref 3.87–5.11)
RDW: 15.6 % — ABNORMAL HIGH (ref 11.5–15.5)
WBC: 6.8 10*3/uL (ref 4.0–10.5)

## 2018-01-15 NOTE — Patient Instructions (Addendum)
It was good to see you again today- I will be in touch with your labs and we will set you up to see neurology to their opinion If you have any change or worsening of your symptoms please let me know!

## 2018-01-16 ENCOUNTER — Encounter: Payer: Self-pay | Admitting: Family Medicine

## 2018-01-19 ENCOUNTER — Encounter: Payer: Self-pay | Admitting: Family Medicine

## 2018-01-19 DIAGNOSIS — R9431 Abnormal electrocardiogram [ECG] [EKG]: Secondary | ICD-10-CM

## 2018-01-22 ENCOUNTER — Ambulatory Visit: Payer: Self-pay

## 2018-01-22 NOTE — Telephone Encounter (Signed)
Called pt- since she is having active chest pain she should be seen immediately at the ER.  We have ordered the echo based on an abnormal EKG reading but active chest pain is a different issue  She states understanding

## 2018-01-22 NOTE — Telephone Encounter (Signed)
I have already called pt

## 2018-01-22 NOTE — Telephone Encounter (Signed)
Patient called and says "I have been talking to Dr. Patsy Lageropland through MyChart about my chest pain and she said she was going to order a heart ultrasound. I wanted to know if it is already scheduled or what do I need to do." I advised I can triage her chest pain and schedule an appointment to see Dr. Patsy Lageropland,  she says "I just want to know about that." I asked her to hold while I call the office, she agreed. I called the office and spoke to Clydie BraunKaren, [facility] who says she will ask Dr. Patsy Lageropland and call the patient back. I advised the patient and she agrees to a return call.  Reason for Disposition . [1] Caller requesting NON-URGENT health information AND [2] PCP's office is the best resource  Answer Assessment - Initial Assessment Questions 1. REASON FOR CALL or QUESTION: "What is your reason for calling today?" or "How can I best help you?" or "What question do you have that I can help answer?"     I wanted to know about the cardiac ultrasound, is it set up?  Protocols used: INFORMATION ONLY CALL-A-AH

## 2018-01-23 ENCOUNTER — Ambulatory Visit (HOSPITAL_COMMUNITY): Payer: 59 | Attending: Cardiovascular Disease

## 2018-01-23 ENCOUNTER — Other Ambulatory Visit: Payer: Self-pay

## 2018-01-23 ENCOUNTER — Encounter: Payer: Self-pay | Admitting: Family Medicine

## 2018-01-23 DIAGNOSIS — E669 Obesity, unspecified: Secondary | ICD-10-CM | POA: Diagnosis not present

## 2018-01-23 DIAGNOSIS — R9431 Abnormal electrocardiogram [ECG] [EKG]: Secondary | ICD-10-CM | POA: Diagnosis present

## 2018-01-23 DIAGNOSIS — Z6831 Body mass index (BMI) 31.0-31.9, adult: Secondary | ICD-10-CM | POA: Diagnosis not present

## 2018-01-26 ENCOUNTER — Telehealth: Payer: Self-pay | Admitting: *Deleted

## 2018-01-26 NOTE — Telephone Encounter (Signed)
[facility], completed as much as possible; forwarded to provider/SLS 08/23

## 2018-02-06 ENCOUNTER — Other Ambulatory Visit: Payer: Self-pay | Admitting: Family Medicine

## 2018-03-13 ENCOUNTER — Institutional Professional Consult (permissible substitution): Payer: BLUE CROSS/BLUE SHIELD | Admitting: Neurology
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
## 2018-06-20 ENCOUNTER — Ambulatory Visit: Payer: 59 | Admitting: Family Medicine

## 2018-06-20 ENCOUNTER — Encounter: Payer: Self-pay | Admitting: Family Medicine

## 2018-06-20 VITALS — BP 122/82 | HR 61 | Resp 16 | Ht 66.0 in | Wt 194.0 lb

## 2018-06-20 DIAGNOSIS — Z23 Encounter for immunization: Secondary | ICD-10-CM | POA: Diagnosis not present

## 2018-06-20 DIAGNOSIS — F430 Acute stress reaction: Secondary | ICD-10-CM | POA: Diagnosis not present

## 2018-06-20 NOTE — Patient Instructions (Signed)
It was nice to see you today, but I am sorry that you have been having a hard time.  I will complete your FMLA paperwork today and will fax it in for you.  In the meantime, you may certainly take the letter I gave you by your job. Please let us know if you are having any other concerns, or if I can do any further to help you.  You got your flu shot today

## 2018-07-06 ENCOUNTER — Telehealth: Payer: Self-pay | Admitting: *Deleted

## 2018-07-06 NOTE — Telephone Encounter (Signed)
Received Request for Updates on FMLA/STD paperwork from [facility]; completed as much as possible; forwarded to provider/SLS 01/31

## 2018-07-31 ENCOUNTER — Other Ambulatory Visit: Payer: Self-pay | Admitting: Family Medicine

## 2018-09-19 ENCOUNTER — Ambulatory Visit (INDEPENDENT_AMBULATORY_CARE_PROVIDER_SITE_OTHER): Payer: 59 | Admitting: Family Medicine

## 2018-09-19 DIAGNOSIS — M5416 Radiculopathy, lumbar region: Secondary | ICD-10-CM

## 2018-09-19 NOTE — Progress Notes (Signed)
[facility] at [facility] [address] [place], [place] [address] [phone]  Date:  09/20/2018   Name:  Mary Joyce   DOB:  10-May-1981   MRN:  [id number]  PCP:  Pearline Cables, MD    Chief Complaint: Leg Pain (bilateral leg pain, numbness)   History of Present Illness:  Mary Joyce is a 38 y.o. very pleasant female patient who presents with the following:  Face-to-face visit today-we attempted a well visit yesterday but her concern needed in person evaluation  History of depression and anxiety Last seen here in January  She has noted pain in her left leg from her ankle up her thigh for the last 2 months-described as sharp and shooting Now her right leg has been going numb as well - this has been the case for about 2 weeks  The right leg will feel stiff and like it is difficult to walk.  No falls No back pain Never had this in the past  No bowel or bladder dysfunction No known injury  She was not sure what to try OTC so has not used any medication  She has not been sick at all -no cough, fever, shortness of breath Status post hysterectomy so no risk of pregnancy No unusual headaches or other neurologic symptoms  Patient Active Problem List   Diagnosis Date Noted  . Obesity 07/31/2014  . MDD (major depressive disorder) 05/02/2013  . Anxiety and depression 02/20/2013    Past Medical History:  Diagnosis Date  . Anemia   . Anxiety   . Depression   . Fibromyalgia   . H/O menorrhagia   . History of keloid of skin   . History of uterine fibroid     Past Surgical History:  Procedure Laterality Date  . ABDOMINAL HYSTERECTOMY    . CESAREAN SECTION    . HYSTEROSCOPY  2008   with resection of fibroid   . LAPAROSCOPIC VAGINAL HYSTERECTOMY  2009  . TUBAL LIGATION Bilateral 2001    Social History   Tobacco Use  . Smoking status: Never Smoker  . Smokeless tobacco: Never Used  Substance Use Topics  . Alcohol  use: No    Alcohol/week: 0.0 standard drinks  . Drug use: No    Family History  Problem Relation Age of Onset  . Hypertension Mother   . Heart disease Father   . Congestive Heart Failure Father   . Cancer Maternal Grandmother   . Diabetes Unknown     No Known Allergies  Medication list has been reviewed and updated.  Current Outpatient Medications on File Prior to Visit  Medication Sig Dispense Refill  . QUEtiapine (SEROQUEL) 100 MG tablet TAKE 1 TABLET(100 MG) BY MOUTH AT BEDTIME 30 tablet 5   No current facility-administered medications on file prior to visit.     Review of Systems:  As per HPI- otherwise negative. No fever chills, no cough No history of mania  Physical Examination: Vitals:   09/20/18 0821  BP: 126/78  Pulse: 69  Resp: 16  Temp: 98 F (36.7 C)  SpO2: 98%   Vitals:   09/20/18 0821  Weight: 195 lb (88.5 kg)  Height: 5\' 6"  (1.676 m)   Body mass index is 31.47 kg/m. Ideal Body Weight: Weight in (lb) to have BMI = 25: 154.6  GEN: WDWN, NAD, Non-toxic, A & O x 3, overweight, looks well HEENT: Atraumatic, Normocephalic. Neck supple. No masses, No LAD.  Ears and Nose: No external deformity. CV: RRR, No M/G/R. No JVD. No thrill. No extra heart sounds. PULM: CTA B, no wheezes, crackles, rhonchi. No retractions. No resp. distress. No accessory muscle use. EXTR: No c/c/e NEURO Normal gait.  PSYCH: Normally interactive. Conversant. Not depressed or anxious appearing.  Calm demeanor.  Lumbar exam: Normal flexion and extension.  No tenderness to palpation of her spine Both lower extremities display normal strength, normal sensation, negative straight leg raise.  She feels that her sensation to touch is symmetrical on both sides.  She has reduced patellar reflexes bilaterally, symmetrical   Dg Lumbar Spine Complete  Result Date: 09/20/2018 CLINICAL DATA:  Low back pain and radiculopathy. EXAM: LUMBAR SPINE - COMPLETE 4+ VIEW COMPARISON:  None.  FINDINGS: Anatomic alignment. No vertebral compression deformity. Disc height is maintained. No definite acute fracture. IMPRESSION: No acute bony pathology. Electronically Signed   By: Jolaine ClickArthur  Hoss M.D.   On: 09/20/2018 09:29    Assessment and Plan: Numbness and tingling of right lower extremity - Plan: predniSONE (DELTASONE) 20 MG tablet, DG Lumbar Spine Complete  Pain in left leg - Plan: DG Lumbar Spine Complete  Patient has noticed pain in her left leg, and now numbness in her right leg for 2 months and 2 weeks respectively.  Her physical exam is benign as above.  Discussed with patient in detail, we wonder if she may have a lumbar disc causing nerve compression.  Will obtain plain films today, and start on prednisone.  If prednisone does not resolve her symptoms however we will plan to look further, consider MRI  Discussed use of prednisone with her today, she understands and is in agreement with plan  Send patient a MyChart message with her x-ray report  Signed Abbe AmsterdamJessica Copland, MD

## 2018-09-19 NOTE — Progress Notes (Signed)
[facility] at [facility] [address], [address] [place], [place] [address] [phone]  Date:  09/19/2018   Name:  Mary Joyce   DOB:  08-23-1980   MRN:  [id number]  PCP:  Pearline Cables, MD    Chief Complaint: No chief complaint on file.   History of Present Illness:  Mary Joyce is a 38 y.o. very pleasant female patient who presents with the following: Virtual visit today due to COVID 19 outbreak Pt location is home Pt ID confirmed with name and DOB She gives consent for virtual visit today   History of depression and anxiety Last seen here in January  She has noted pain in her left leg from her ankle up her thigh for the last 2 months- sharp and shooting Now her right leg has been going numb as well - this has been the case for about 2 weeks  The right leg will feel stiff and like it is difficult to walk No back pain Never had this in the past  No bowel or bladder dysfunction  She has not been sick at all -no cough, fever, shortness of breath  Patient Active Problem List   Diagnosis Date Noted  . Obesity 07/31/2014  . MDD (major depressive disorder) 05/02/2013  . Anxiety and depression 02/20/2013    Past Medical History:  Diagnosis Date  . Anemia   . Anxiety   . Depression   . Fibromyalgia   . H/O menorrhagia   . History of keloid of skin   . History of uterine fibroid     Past Surgical History:  Procedure Laterality Date  . ABDOMINAL HYSTERECTOMY    . CESAREAN SECTION    . HYSTEROSCOPY  2008   with resection of fibroid   . LAPAROSCOPIC VAGINAL HYSTERECTOMY  2009  . TUBAL LIGATION Bilateral 2001    Social History   Tobacco Use  . Smoking status: Never Smoker  . Smokeless tobacco: Never Used  Substance Use Topics  . Alcohol use: No    Alcohol/week: 0.0 standard drinks  . Drug use: No    Family History  Problem Relation Age of Onset  . Hypertension Mother   . Heart disease Father   . Congestive  Heart Failure Father   . Cancer Maternal Grandmother   . Diabetes Unknown     No Known Allergies  Medication list has been reviewed and updated.  Current Outpatient Medications on File Prior to Visit  Medication Sig Dispense Refill  . QUEtiapine (SEROQUEL) 100 MG tablet TAKE 1 TABLET(100 MG) BY MOUTH AT BEDTIME 30 tablet 5   No current facility-administered medications on file prior to visit.     Review of Systems:  As per HPI- otherwise negative.   Physical Examination: There were no vitals filed for this visit. There were no vitals filed for this visit. There is no height or weight on file to calculate BMI. Ideal Body Weight:   Virtual visit  Assessment and Plan:  Begin virtual visit for possible lumbar radiculopathy.  However, I think patient needs a face-to-face visit.  It is too late in the day to do this today. She will come in tomorrow at 8:20 No charge for virtual visit Signed Abbe Amsterdam, MD

## 2018-09-20 ENCOUNTER — Encounter: Payer: Self-pay | Admitting: Family Medicine

## 2018-09-20 ENCOUNTER — Ambulatory Visit (HOSPITAL_BASED_OUTPATIENT_CLINIC_OR_DEPARTMENT_OTHER)
Admission: RE | Admit: 2018-09-20 | Discharge: 2018-09-20 | Disposition: A | Discharge status: Home / Self Care | Payer: 59 | Source: Ambulatory Visit | Attending: Family Medicine | Admitting: Family Medicine

## 2018-09-20 ENCOUNTER — Ambulatory Visit: Payer: 59 | Admitting: Family Medicine

## 2018-09-20 ENCOUNTER — Other Ambulatory Visit: Payer: Self-pay

## 2018-09-20 VITALS — BP 126/78 | HR 69 | Temp 98.0°F | Resp 16 | Ht 66.0 in | Wt 195.0 lb

## 2018-09-20 DIAGNOSIS — R200 Anesthesia of skin: Secondary | ICD-10-CM | POA: Diagnosis present

## 2018-09-20 DIAGNOSIS — M79605 Pain in left leg: Secondary | ICD-10-CM | POA: Insufficient documentation

## 2018-09-20 DIAGNOSIS — R202 Paresthesia of skin: Secondary | ICD-10-CM | POA: Diagnosis present

## 2018-09-20 MED ORDER — PREDNISONE 20 MG PO TABS: ORAL_TABLET | ORAL | 0 refills | Status: DC

## 2018-09-20 NOTE — Patient Instructions (Signed)
Please go to the ground floor for x-rays, than you can head home.  I will be in touch with your reports asap  We will also try prednisone for 10 days- I hope that this may relieve your symptoms.  While you are on prednisone avoid using ibuprofen/ aleve.  Tylenol is ok however!    Please let me know how you respond over the next 2-3 days- Sooner if worse.

## 2018-09-28 ENCOUNTER — Encounter: Payer: Self-pay | Admitting: Family Medicine

## 2018-10-10 ENCOUNTER — Other Ambulatory Visit: Payer: Self-pay | Admitting: Family Medicine

## 2018-10-10 ENCOUNTER — Ambulatory Visit (INDEPENDENT_AMBULATORY_CARE_PROVIDER_SITE_OTHER): Payer: 59 | Admitting: Family Medicine

## 2018-10-10 ENCOUNTER — Encounter: Payer: Self-pay | Admitting: Family Medicine

## 2018-10-10 ENCOUNTER — Other Ambulatory Visit: Payer: Self-pay

## 2018-10-10 ENCOUNTER — Ambulatory Visit: Payer: Self-pay

## 2018-10-10 DIAGNOSIS — R059 Cough, unspecified: Secondary | ICD-10-CM

## 2018-10-10 DIAGNOSIS — R05 Cough: Secondary | ICD-10-CM

## 2018-10-10 MED ORDER — DOXYCYCLINE HYCLATE 100 MG PO CAPS: 100.0000 mg | ORAL_CAPSULE | Freq: Two times a day (BID) | ORAL | 0 refills | Status: DC

## 2018-10-10 MED ORDER — BENZONATATE 200 MG PO CAPS: 200.0000 mg | ORAL_CAPSULE | Freq: Three times a day (TID) | ORAL | 1 refills | Status: DC | PRN

## 2018-10-10 NOTE — Telephone Encounter (Signed)
Pt. Called to report 1.5 week hx. Of cough.  Stated she has dry cough; denied fever or shortness of breath.  Stated her chest is sore from the frequent cough, and also has intermittent chest tightness.  Denied any other symptoms; denied headache, body aches, shortness of breath, wheezing.  Stated her throat is sore from the freq. Cough.  Reported she had 3 days last week with freq. Cough, then it seemed to improve, and now it is worse again.  Stated she has been working remotely; currently in [place], [place] at her boyfriend's house.  Denied any known exposure to COVID 19.  Denied any travel other than from [place] to [place].  During Triage questions, pt. Coughed very frequently; had difficulty finishing sentences due to the persistent coughing.  Advised she should go to an ER in her location, due to the persistent cough; advised she may need to be tested for COVID 19, and may need a chest xray.  Advised if she has COVID 19, her symptoms could potentially worsen quickly.  Pt. declined advice.  Stated "I would rather talk to Dr. Patsy Lager, because she knows me."  Due to pt. not being receptive to seeking treatment, at local ER, called Flow Coordinator.  Appt. Scheduled at 4:00 PM with Dr. Patsy Lager.  Pt. Agreed with plan.       Reason for Disposition . [1] Continuous (nonstop) coughing interferes with work or school AND [2] no improvement using cough treatment per protocol  Answer Assessment - Initial Assessment Questions 1. COVID-19 DIAGNOSIS: "Who made your Coronavirus (COVID-19) diagnosis?" "Was it confirmed by a positive lab test?" If not diagnosed by a HCP, ask "Are there lots of cases (community spread) where you live?" (See public health department website, if unsure)   * MAJOR community spread: high number of cases; numbers of cases are increasing; many people hospitalized.   * MINOR community spread: low number of cases; not increasing; few or no people hospitalized     *No Answer* 2. ONSET:  "When did the COVID-19 symptoms start?"      1.5 weeks 3. WORST SYMPTOM: "What is your worst symptom?" (e.g., cough, fever, shortness of breath, muscle aches)     Dry cough  4. COUGH: "Do you have a cough?" If so, ask: "How bad is the cough?"       Stated a dry cough; having difficulty talking and completing a call as she is working from home  5. FEVER: "Do you have a fever?" If so, ask: "What is your temperature, how was it measured, and when did it start?"    Denied fever  6. RESPIRATORY STATUS: "Describe your breathing?" (e.g., shortness of breath, wheezing, unable to speak)      Denied shortness of breath or wheezing 7. BETTER-SAME-WORSE: "Are you getting better, staying the same or getting worse compared to yesterday?"  If getting worse, ask, "In what way?"     Was getting better, now feels worse 8. HIGH RISK DISEASE: "Do you have any chronic medical problems?" (e.g., asthma, heart or lung disease, weak immune system, etc.)     Denied asthma, denied DM, heart disease 9. PREGNANCY: "Is there any chance you are pregnant?" "When was your last menstrual period?"     LMP: hysterectomy 10. OTHER SYMPTOMS: "Do you have any other symptoms?"  (e.g., runny nose, headache, sore throat, loss of smell)       Chest tightness that comes and goes; across chest.  Also c/o chest soreness ; denied sore throat;  denied body aches, denied headache.  Protocols used: CORONAVIRUS (COVID-19) DIAGNOSED OR SUSPECTED-A-AH

## 2018-10-10 NOTE — Telephone Encounter (Signed)
FYI upcoming appt at 4pm.

## 2018-10-10 NOTE — Patient Instructions (Signed)
It was good to talk with you today, stay safe in [place]! As we discussed, I sent in prescriptions for an antibiotic, called doxycycline, and also cough medication.  I gave you a nondrowsy cough medicine, if you need something stronger but me know. I put a work note into your MyChart account, you can print this out from home  Take care

## 2018-10-10 NOTE — Progress Notes (Signed)
[facility] at [facility] [address], [address] [place], [place] [address] [phone]  Date:  10/10/2018   Name:  Mary Joyce   DOB:  Apr 19, 1981   MRN:  [id number]  PCP:  Pearline Cables, MD    Chief Complaint: No chief complaint on file.   History of Present Illness:  Mary Joyce is a 38 y.o. very pleasant female patient who presents with the following:  Virtual visit today for concern of illness Patient location is home of her boyfriend, [place] Provider location is home Patient any confirmed with 2 factors, she is agreeable to virtual visit today I had seen her in the office just about 3 weeks ago with pain and numbness in her legs.  We gave her a course of prednisone and did plain films- planned for MRI if her sx did not resolve  She called in today with the following concern:  Pt. Called to report 1.5 week hx. Of cough.  Stated she has dry cough; denied fever or shortness of breath.  Stated her chest is sore from the frequent cough, and also has intermittent chest tightness.  Denied any other symptoms; denied headache, body aches, shortness of breath, wheezing.  Stated her throat is sore from the freq. Cough.  Reported she had 3 days last week with freq. Cough, then it seemed to improve, and now it is worse again.  Stated she has been working remotely; currently in [place], [place] at her boyfriend's house.  Denied any known exposure to COVID 19.  Denied any travel other than from [place] to [place].  During Triage questions, pt. Coughed very frequently; had difficulty finishing sentences due to the persistent coughing.  Advised she should go to an ER in her location, due to the persistent cough; advised she may need to be tested for COVID 19, and may need a chest xray.  Advised if she has COVID 19, her symptoms could potentially worsen quickly.  Pt. declined advice.  Stated "I would rather talk to Dr. Patsy Lager, because she knows  me."  Due to pt. not being receptive to seeking treatment, at local ER, called Flow Coordinator.  Appt. Scheduled at 4:00 PM with Dr. Patsy Lager.  Pt. Agreed with plan.       She notes a cough for 2 weeks No other symptoms She is staying with her BF in [facility] [facility]- he is well They have been self- isolating at home, working from home, she does not think she would have been exposed to code No fever No SOB She does not feel distressed Sometimes the cough is just really driving her crazy, can be constant  She does not feel flu like at all She is having a hard time working as she is coughing so much  No vomiting or diarrhea The cough is not keeping her awake at night -daytime cough is more of the concern  She is s/p hyst so we are not concerned about pregnancy  Her back and leg sx are basically resolved  She was out of work 3 days last week due to illness- needs a note for work  Patient Active Problem List   Diagnosis Date Noted  . Obesity 07/31/2014  . MDD (major depressive disorder) 05/02/2013  . Anxiety and depression 02/20/2013    Past Medical History:  Diagnosis Date  . Anemia   . Anxiety   . Depression   . Fibromyalgia   . H/O menorrhagia   .  History of keloid of skin   . History of uterine fibroid     Past Surgical History:  Procedure Laterality Date  . ABDOMINAL HYSTERECTOMY    . CESAREAN SECTION    . HYSTEROSCOPY  2008   with resection of fibroid   . LAPAROSCOPIC VAGINAL HYSTERECTOMY  2009  . TUBAL LIGATION Bilateral 2001    Social History   Tobacco Use  . Smoking status: Never Smoker  . Smokeless tobacco: Never Used  Substance Use Topics  . Alcohol use: No    Alcohol/week: 0.0 standard drinks  . Drug use: No    Family History  Problem Relation Age of Onset  . Hypertension Mother   . Heart disease Father   . Congestive Heart Failure Father   . Cancer Maternal Grandmother   . Diabetes Unknown     No Known Allergies  Medication list has been reviewed and  updated.  Current Outpatient Medications on File Prior to Visit  Medication Sig Dispense Refill  . predniSONE (DELTASONE) 20 MG tablet Take 2 pills daily for 5 days, then 1 pill daily for 5 days 15 tablet 0  . QUEtiapine (SEROQUEL) 100 MG tablet TAKE 1 TABLET(100 MG) BY MOUTH AT BEDTIME 30 tablet 5   No current facility-administered medications on file prior to visit.     Review of Systems:  She is walking in the evenings  Physical Examination: There were no vitals filed for this visit. There were no vitals filed for this visit. There is no height or weight on file to calculate BMI. Ideal Body Weight:    Pt observed over facetime video She looks well, coughs occasionally but no distress, wheezing, tachypnea is observed  Assessment and Plan: Cough - Plan: benzonatate (TESSALON) 200 MG capsule, doxycycline (VIBRAMYCIN) 100 MG capsule  Virtual visit today for concern of cough, possible bronchitis as cough has persisted for 2 weeks.  Prescription given for doxycycline, Tessalon Perles.  She is not being kept awake at night, so did not use a narcotic cough syrup.  However I advised her to contact me if she needs something stronger for cough I also suggested that she try an OTC allergy medication, as allergies may be contributing Wrote note for her job today that she can print for my chart  Signed Abbe AmsterdamJessica Majesti Gambrell, MD

## 2019-01-12 ENCOUNTER — Other Ambulatory Visit: Payer: Self-pay | Admitting: Family Medicine

## 2019-05-21 ENCOUNTER — Encounter: Payer: Self-pay | Admitting: Family Medicine

## 2019-05-21 DIAGNOSIS — B9689 Other specified bacterial agents as the cause of diseases classified elsewhere: Secondary | ICD-10-CM

## 2019-05-23 MED ORDER — METRONIDAZOLE 500 MG PO TABS: 500.0000 mg | ORAL_TABLET | Freq: Two times a day (BID) | ORAL | 0 refills | Status: DC

## 2019-05-23 NOTE — Addendum Note (Signed)
Addended by: Lamar Blinks C on: 05/23/2019 05:51 AM   Modules accepted: Orders

## 2019-07-21 ENCOUNTER — Encounter: Payer: Self-pay | Admitting: Family Medicine

## 2019-07-22 ENCOUNTER — Other Ambulatory Visit: Payer: Self-pay

## 2019-07-22 MED ORDER — QUETIAPINE FUMARATE 100 MG PO TABS: ORAL_TABLET | ORAL | 0 refills | Status: DC

## 2019-08-23 ENCOUNTER — Other Ambulatory Visit: Payer: Self-pay

## 2019-09-03 ENCOUNTER — Other Ambulatory Visit: Payer: Self-pay | Admitting: Family Medicine

## 2019-09-03 MED ORDER — QUETIAPINE FUMARATE 100 MG PO TABS: ORAL_TABLET | ORAL | 0 refills | Status: DC

## 2019-09-03 NOTE — Telephone Encounter (Signed)
Medication: QUEtiapine (SEROQUEL) 100 MG tablet    Has the patient contacted their pharmacy? Yes.   (If no, request that the patient contact the pharmacy for the refill.) (If yes, when and what did the pharmacy advise?)  Preferred Pharmacy (with phone number or street name): [id number] [place], [place] - [address] AT [facility] [address]  [address] [place], [place] [address]  Phone:  [phone] Fax:  [phone]  DEA #:  [id number]  Agent: Please be advised that RX refills may take up to 3 business days. We ask that you follow-up with your pharmacy.

## 2019-09-08 NOTE — Progress Notes (Signed)
[facility] at [facility] [address], [address] [place], [place] [address] [phone]  Date:  09/09/2019   Name:  Mary Joyce   DOB:  Apr 11, 1981   MRN:  [id number]  PCP:  Pearline Cables, MD    Chief Complaint: No chief complaint on file.   History of Present Illness:  Mary Joyce is a 39 y.o. very pleasant female patient who presents with the following:  Virtual visit today for medication follow-up Patient with history of anxiety and depression, obesity Patient location is home, provider location is office.  Patient identity confirmed with 2 factors, she gives consent for virtual visit today The patient and myself are present on the call today  Last seen by myself about 1 year ago-virtually, for acute illness  She has moved to [facility] and needs a refill of her seroquel-she admits she has not yet found a local healthcare provider, she has been busy and has put off doing this She is taking 100 mg of seroquel at bedtime-she feels like this medication is working very well and wishes to continue She is sleeping ok, She is not feeling sad or anxious    Patient Active Problem List   Diagnosis Date Noted  . Obesity 07/31/2014  . MDD (major depressive disorder) 05/02/2013  . Anxiety and depression 02/20/2013    Past Medical History:  Diagnosis Date  . Anemia   . Anxiety   . Depression   . Fibromyalgia   . H/O menorrhagia   . History of keloid of skin   . History of uterine fibroid     Past Surgical History:  Procedure Laterality Date  . ABDOMINAL HYSTERECTOMY    . CESAREAN SECTION    . HYSTEROSCOPY  2008   with resection of fibroid   . LAPAROSCOPIC VAGINAL HYSTERECTOMY  2009  . TUBAL LIGATION Bilateral 2001    Social History   Tobacco Use  . Smoking status: Never Smoker  . Smokeless tobacco: Never Used  Substance Use Topics  . Alcohol use: No    Alcohol/week: 0.0 standard drinks  . Drug use: No    Family  History  Problem Relation Age of Onset  . Hypertension Mother   . Heart disease Father   . Congestive Heart Failure Father   . Cancer Maternal Grandmother   . Diabetes Unknown     No Known Allergies  Medication list has been reviewed and updated.  Current Outpatient Medications on File Prior to Visit  Medication Sig Dispense Refill  . benzonatate (TESSALON) 200 MG capsule Take 1 capsule (200 mg total) by mouth 3 (three) times daily as needed for cough. 60 capsule 1  . doxycycline (VIBRAMYCIN) 100 MG capsule Take 1 capsule (100 mg total) by mouth 2 (two) times daily. 20 capsule 0  . metroNIDAZOLE (FLAGYL) 500 MG tablet Take 1 tablet (500 mg total) by mouth 2 (two) times daily. 14 tablet 0  . predniSONE (DELTASONE) 20 MG tablet Take 2 pills daily for 5 days, then 1 pill daily for 5 days 15 tablet 0  . QUEtiapine (SEROQUEL) 100 MG tablet TAKE 1 TABLET(100 MG) BY MOUTH AT BEDTIME 30 tablet 0   No current facility-administered medications on file prior to visit.    Review of Systems:  As per HPI- otherwise negative.   Physical Examination: There were no vitals filed for this visit. There were no vitals filed for this visit. There is no height or weight on  file to calculate BMI. Ideal Body Weight:    Patient observed via video monitor.  She looks well, no cough, wheezing, distress is noted  Assessment and Plan: Major depressive disorder with single episode, in full remission ([place]) - Plan: QUEtiapine (SEROQUEL) 100 MG tablet  Here today for a follow-up visit.  She is doing well on current dose of Seroquel.  I did refill this medication for her, but advised that she is well overdue for labs.  I would encourage her to establish with a local provider, we are also glad to see her in the office should she be nearby She agrees to be seen in person by provider of her choice within the next 2 or 3 months  Signed Lamar Blinks, MD

## 2019-09-09 ENCOUNTER — Other Ambulatory Visit: Payer: Self-pay

## 2019-09-09 ENCOUNTER — Encounter: Payer: Self-pay | Admitting: Family Medicine

## 2019-09-09 ENCOUNTER — Ambulatory Visit (INDEPENDENT_AMBULATORY_CARE_PROVIDER_SITE_OTHER): Payer: Self-pay | Admitting: Family Medicine

## 2019-09-09 DIAGNOSIS — F325 Major depressive disorder, single episode, in full remission: Secondary | ICD-10-CM

## 2019-09-09 MED ORDER — QUETIAPINE FUMARATE 100 MG PO TABS: ORAL_TABLET | ORAL | 1 refills | Status: DC

## 2019-10-02 ENCOUNTER — Telehealth: Payer: Self-pay | Admitting: *Deleted

## 2019-10-02 NOTE — Telephone Encounter (Signed)
Received fax from [place], [place] for Quetiapine 100mg . Rx was sent 09/09/19, #90 x 1 refill. Spoke with pharmacist. She states that RX was transferred to a [facility]. After further looking, this request was an automated request. They will delete request and nothing further is needed at this time.

## 2020-02-21 ENCOUNTER — Encounter: Payer: Self-pay | Admitting: Family Medicine

## 2020-02-21 DIAGNOSIS — R059 Cough, unspecified: Secondary | ICD-10-CM

## 2020-03-13 MED ORDER — BENZONATATE 200 MG PO CAPS: 200.0000 mg | ORAL_CAPSULE | Freq: Three times a day (TID) | ORAL | 1 refills | Status: AC | PRN

## 2020-03-13 NOTE — Addendum Note (Signed)
Addended by: Abbe Amsterdam C on: 03/13/2020 04:47 AM   Modules accepted: Orders

## 2020-03-18 ENCOUNTER — Other Ambulatory Visit: Payer: Self-pay | Admitting: Family Medicine

## 2020-03-18 DIAGNOSIS — F325 Major depressive disorder, single episode, in full remission: Secondary | ICD-10-CM

## 2020-03-30 ENCOUNTER — Encounter: Payer: Self-pay | Admitting: Family Medicine

## 2020-03-30 DIAGNOSIS — F325 Major depressive disorder, single episode, in full remission: Secondary | ICD-10-CM

## 2020-03-31 MED ORDER — QUETIAPINE FUMARATE 100 MG PO TABS: ORAL_TABLET | ORAL | 1 refills | Status: DC

## 2020-07-02 ENCOUNTER — Other Ambulatory Visit: Payer: Self-pay | Admitting: Family Medicine

## 2020-07-02 DIAGNOSIS — F325 Major depressive disorder, single episode, in full remission: Secondary | ICD-10-CM

## 2020-07-05 ENCOUNTER — Encounter: Payer: Self-pay | Admitting: Family Medicine

## 2020-07-05 DIAGNOSIS — F325 Major depressive disorder, single episode, in full remission: Secondary | ICD-10-CM

## 2020-07-06 MED ORDER — QUETIAPINE FUMARATE 100 MG PO TABS: ORAL_TABLET | ORAL | 1 refills | Status: AC

## 2020-08-08 IMAGING — DX LUMBAR SPINE - COMPLETE 4+ VIEW
5 series · 5 of 5 positions shown · non-contrast
Comparison: None.

CLINICAL DATA: Low back pain and radiculopathy.

EXAM:
LUMBAR SPINE - COMPLETE 4+ VIEW

[l-spine ap]
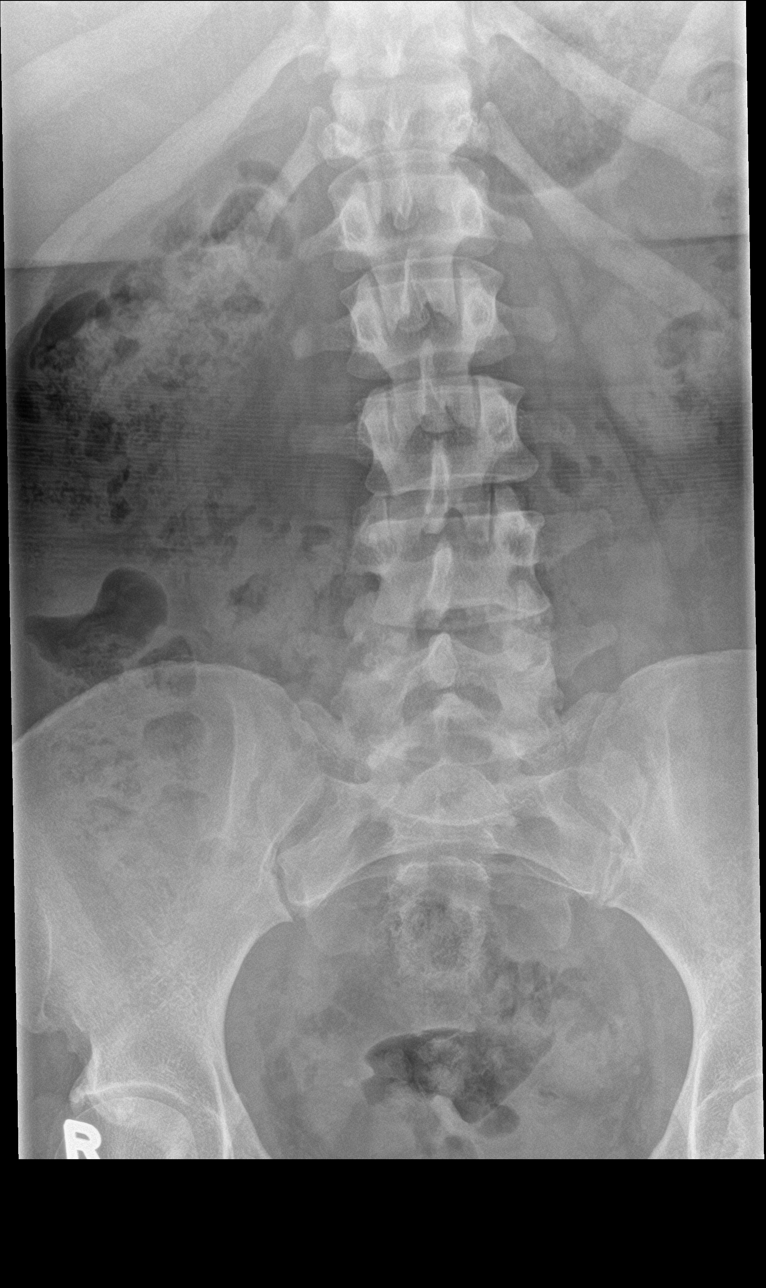

[l-spine obl (1 of 2)]
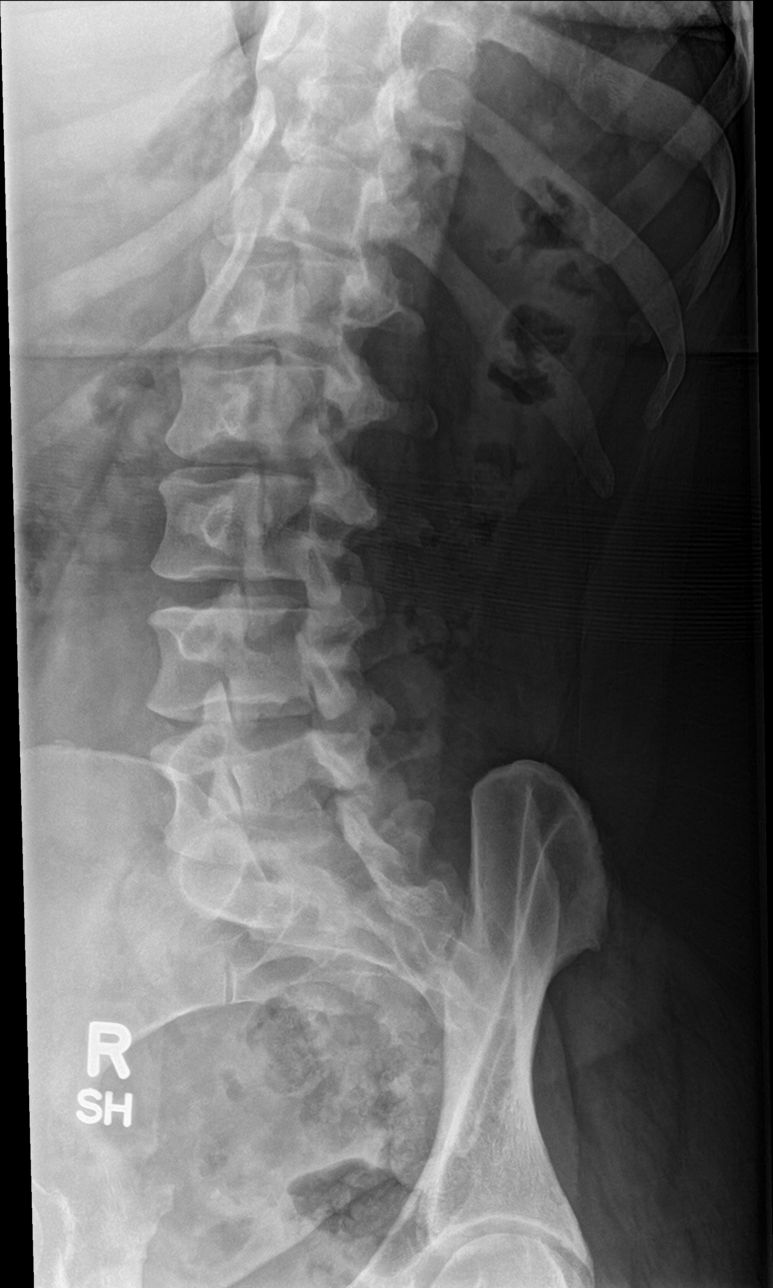

[l-spine obl (2 of 2)]
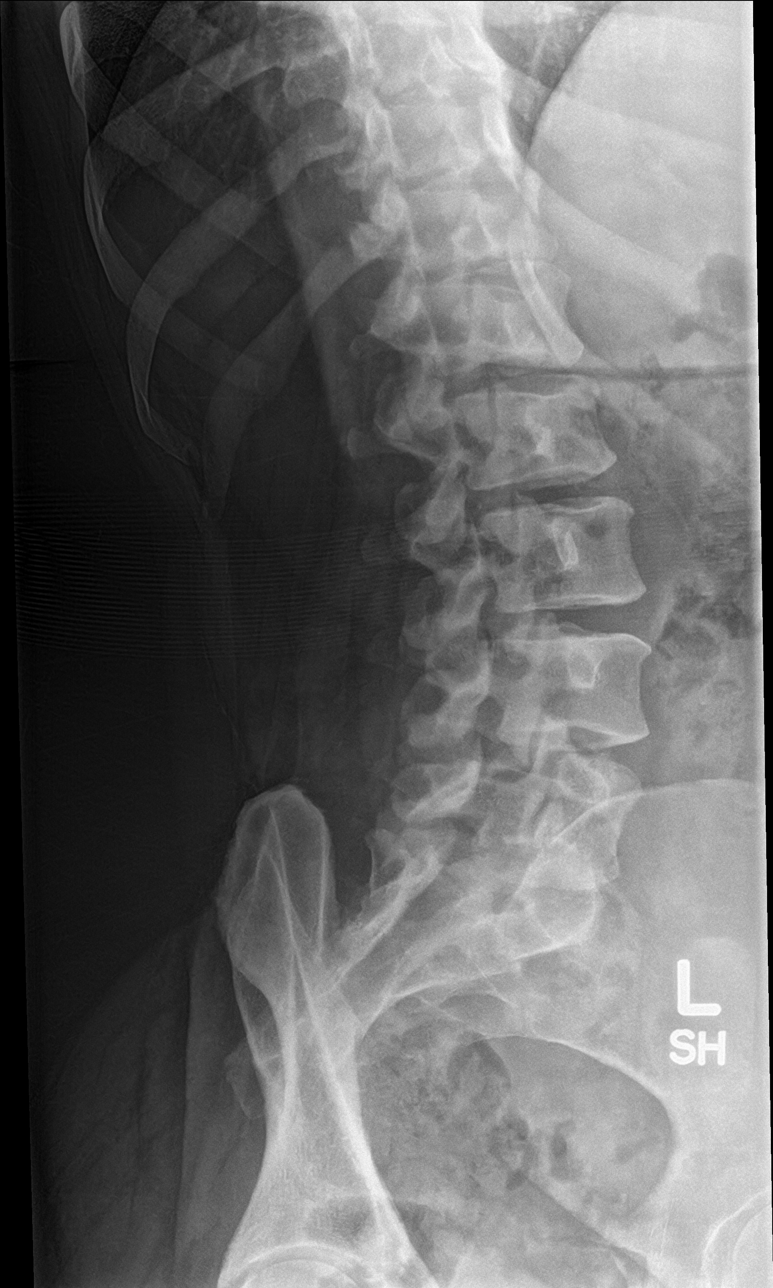

[l-spine lat]
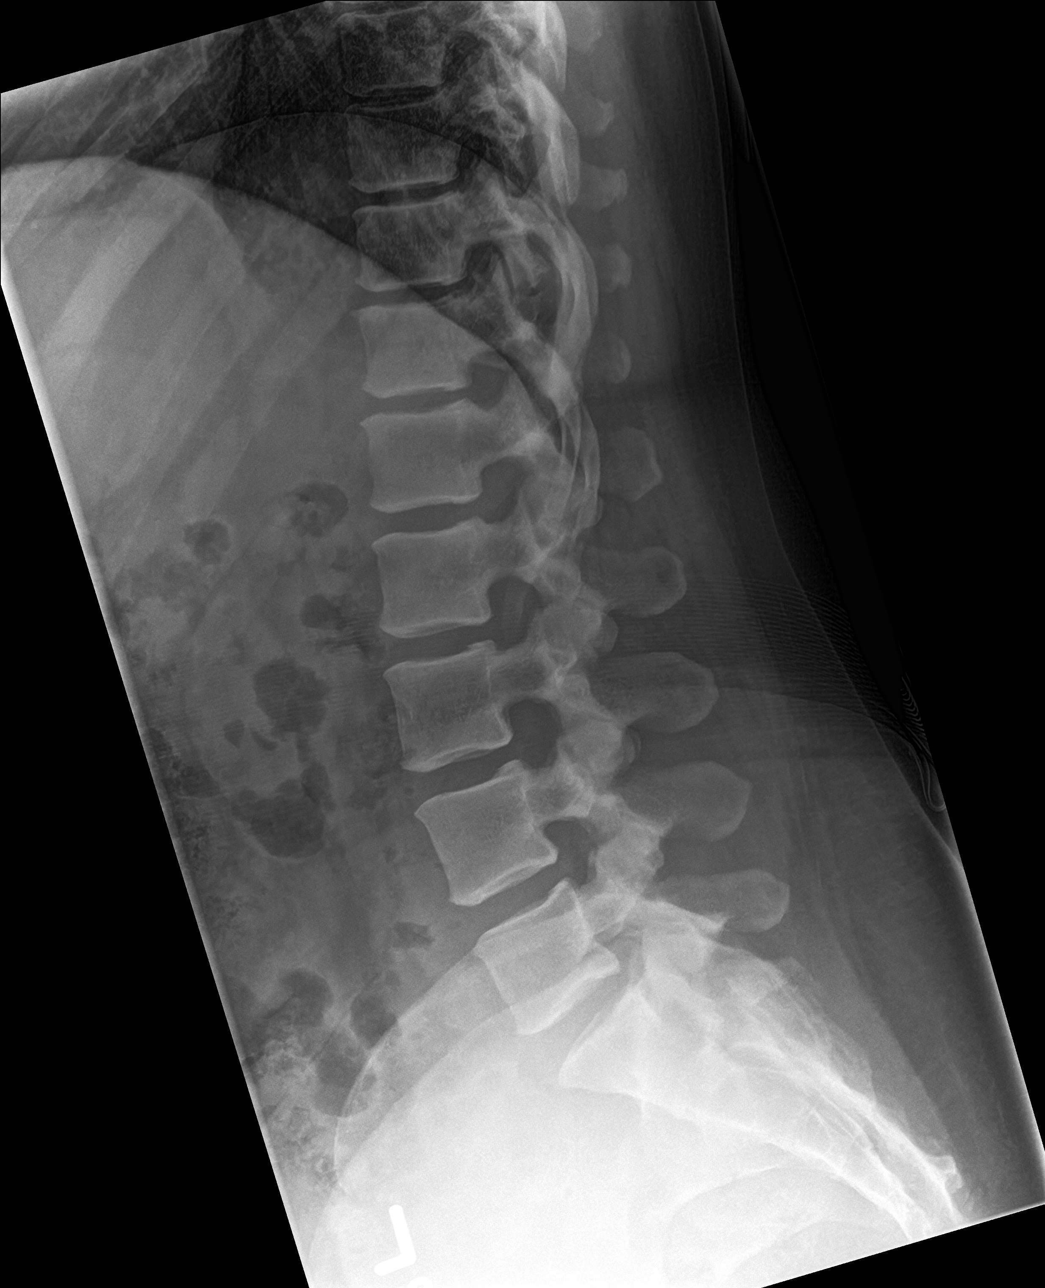

[l-spine spot]
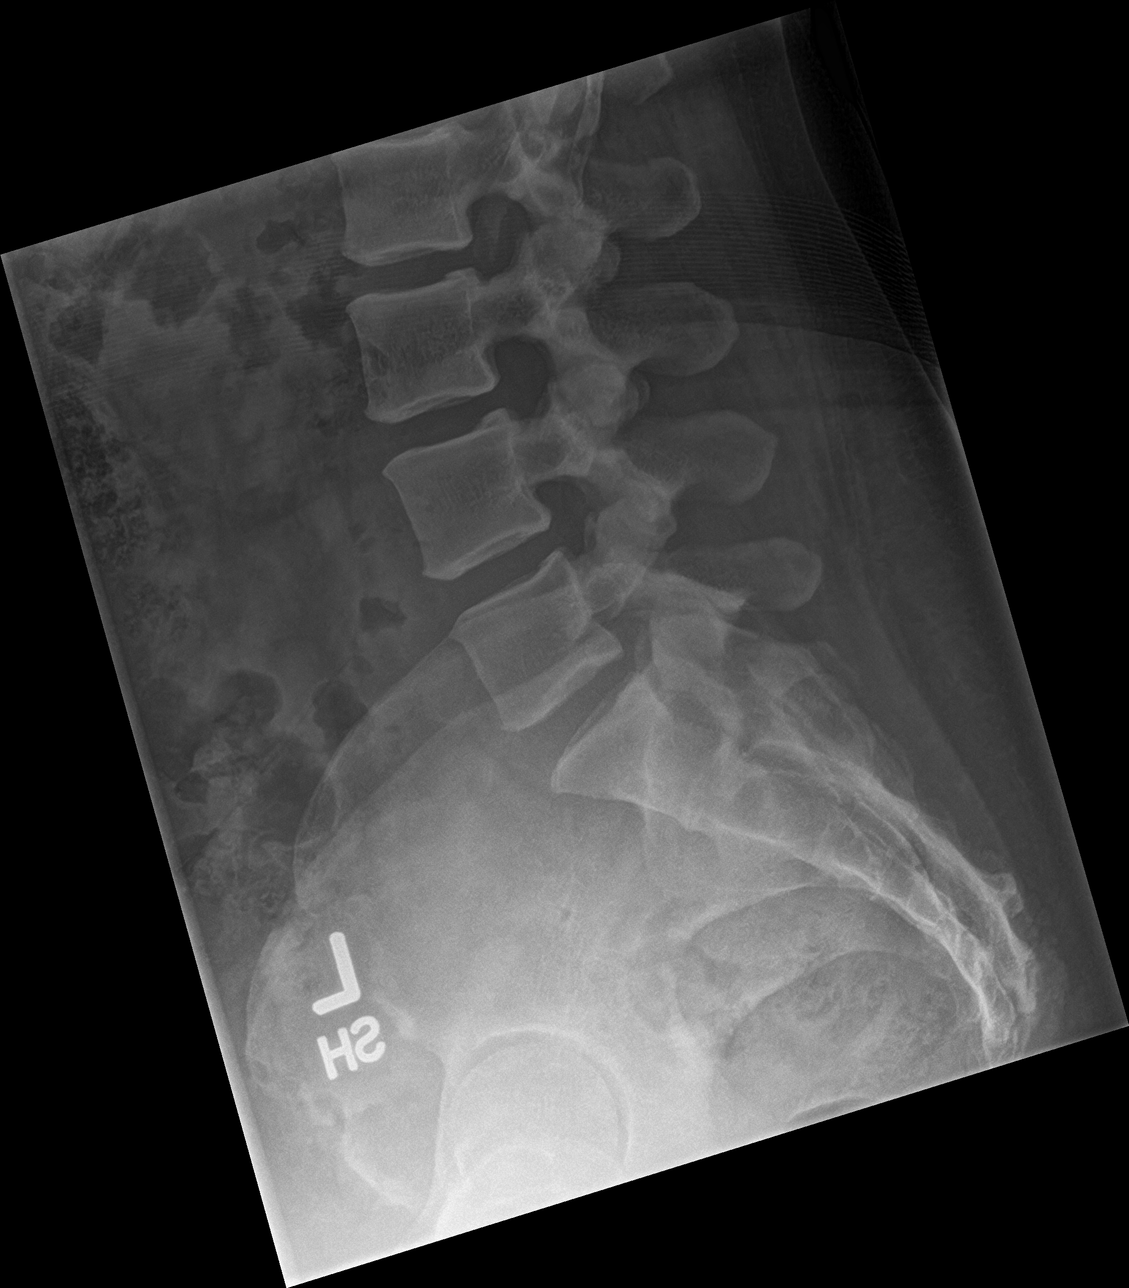

[5 of 5 positions shown; findings below may reference images not displayed]

FINDINGS: Anatomic alignment. No vertebral compression deformity. Disc height
is maintained. No definite acute fracture.
IMPRESSION: No acute bony pathology.

## 2020-11-26 ENCOUNTER — Other Ambulatory Visit: Payer: Self-pay | Admitting: Family Medicine

## 2020-11-26 DIAGNOSIS — F325 Major depressive disorder, single episode, in full remission: Secondary | ICD-10-CM

## 2020-12-28 ENCOUNTER — Other Ambulatory Visit: Payer: Self-pay | Admitting: Family Medicine

## 2020-12-28 DIAGNOSIS — F325 Major depressive disorder, single episode, in full remission: Secondary | ICD-10-CM
